# Patient Record
Sex: Female | Born: 1965 | Race: White | Hispanic: No | Marital: Married | Smoking: Former smoker
Health system: Southern US, Community
[De-identification: ages and names within clinical notes are randomized; demographics above are authoritative.]

## PROBLEM LIST (undated history)

## (undated) DIAGNOSIS — T7840XA Allergy, unspecified, initial encounter: Secondary | ICD-10-CM

## (undated) DIAGNOSIS — I10 Essential (primary) hypertension: Secondary | ICD-10-CM

## (undated) HISTORY — DX: Allergy, unspecified, initial encounter: T78.40XA

## (undated) HISTORY — DX: Essential (primary) hypertension: I10

---

## 1998-01-03 ENCOUNTER — Ambulatory Visit (HOSPITAL_COMMUNITY): Admission: RE | Admit: 1998-01-03 | Discharge: 1998-01-03 | Payer: Self-pay | Admitting: Obstetrics and Gynecology

## 1998-01-06 ENCOUNTER — Other Ambulatory Visit: Admission: RE | Admit: 1998-01-06 | Discharge: 1998-01-06 | Payer: Self-pay | Admitting: Obstetrics and Gynecology

## 1998-01-08 ENCOUNTER — Inpatient Hospital Stay (HOSPITAL_COMMUNITY): Admission: AD | Admit: 1998-01-08 | Discharge: 1998-01-08 | Payer: Self-pay | Admitting: Obstetrics and Gynecology

## 1998-01-12 ENCOUNTER — Ambulatory Visit (HOSPITAL_COMMUNITY): Admission: AD | Admit: 1998-01-12 | Discharge: 1998-01-12 | Payer: Self-pay | Admitting: Obstetrics and Gynecology

## 1998-01-15 ENCOUNTER — Ambulatory Visit (HOSPITAL_COMMUNITY): Admission: AD | Admit: 1998-01-15 | Discharge: 1998-01-15 | Payer: Self-pay | Admitting: Obstetrics and Gynecology

## 1998-02-24 ENCOUNTER — Ambulatory Visit (HOSPITAL_COMMUNITY): Admission: RE | Admit: 1998-02-24 | Discharge: 1998-02-24 | Payer: Self-pay | Admitting: Obstetrics and Gynecology

## 1998-09-03 ENCOUNTER — Other Ambulatory Visit: Admission: RE | Admit: 1998-09-03 | Discharge: 1998-09-03 | Payer: Self-pay | Admitting: Obstetrics and Gynecology

## 1999-03-13 ENCOUNTER — Inpatient Hospital Stay (HOSPITAL_COMMUNITY): Admission: AD | Admit: 1999-03-13 | Discharge: 1999-03-16 | Payer: Self-pay | Admitting: Obstetrics and Gynecology

## 1999-04-28 ENCOUNTER — Other Ambulatory Visit: Admission: RE | Admit: 1999-04-28 | Discharge: 1999-04-28 | Payer: Self-pay | Admitting: Obstetrics and Gynecology

## 2000-05-23 ENCOUNTER — Other Ambulatory Visit: Admission: RE | Admit: 2000-05-23 | Discharge: 2000-05-23 | Payer: Self-pay | Admitting: Obstetrics and Gynecology

## 2001-07-31 ENCOUNTER — Other Ambulatory Visit: Admission: RE | Admit: 2001-07-31 | Discharge: 2001-07-31 | Payer: Self-pay | Admitting: Obstetrics and Gynecology

## 2002-09-24 ENCOUNTER — Other Ambulatory Visit: Admission: RE | Admit: 2002-09-24 | Discharge: 2002-09-24 | Payer: Self-pay | Admitting: Obstetrics and Gynecology

## 2004-02-24 ENCOUNTER — Other Ambulatory Visit: Admission: RE | Admit: 2004-02-24 | Discharge: 2004-02-24 | Payer: Self-pay | Admitting: Obstetrics and Gynecology

## 2007-03-23 ENCOUNTER — Encounter: Admission: RE | Admit: 2007-03-23 | Discharge: 2007-03-23 | Payer: Self-pay | Admitting: Obstetrics and Gynecology

## 2008-03-26 ENCOUNTER — Encounter: Admission: RE | Admit: 2008-03-26 | Discharge: 2008-03-26 | Payer: Self-pay | Admitting: Obstetrics and Gynecology

## 2010-11-03 ENCOUNTER — Encounter
Admission: RE | Admit: 2010-11-03 | Discharge: 2010-11-03 | Payer: Self-pay | Source: Home / Self Care | Attending: Obstetrics and Gynecology | Admitting: Obstetrics and Gynecology

## 2010-11-22 ENCOUNTER — Encounter: Payer: Self-pay | Admitting: Obstetrics and Gynecology

## 2010-11-23 ENCOUNTER — Encounter: Payer: Self-pay | Admitting: Obstetrics and Gynecology

## 2011-12-24 ENCOUNTER — Other Ambulatory Visit: Payer: Self-pay | Admitting: Obstetrics and Gynecology

## 2011-12-24 DIAGNOSIS — Z1231 Encounter for screening mammogram for malignant neoplasm of breast: Secondary | ICD-10-CM

## 2012-01-12 ENCOUNTER — Ambulatory Visit
Admission: RE | Admit: 2012-01-12 | Discharge: 2012-01-12 | Disposition: A | Discharge status: Home / Self Care | Payer: BC Managed Care – PPO | Source: Ambulatory Visit | Attending: Obstetrics and Gynecology | Admitting: Obstetrics and Gynecology

## 2012-01-12 DIAGNOSIS — Z1231 Encounter for screening mammogram for malignant neoplasm of breast: Secondary | ICD-10-CM

## 2013-02-28 ENCOUNTER — Other Ambulatory Visit: Payer: Self-pay

## 2013-02-28 DIAGNOSIS — Z1231 Encounter for screening mammogram for malignant neoplasm of breast: Secondary | ICD-10-CM

## 2013-03-28 ENCOUNTER — Ambulatory Visit
Admission: RE | Admit: 2013-03-28 | Discharge: 2013-03-28 | Disposition: A | Discharge status: Home / Self Care | Payer: BC Managed Care – PPO | Source: Ambulatory Visit

## 2013-03-28 DIAGNOSIS — Z1231 Encounter for screening mammogram for malignant neoplasm of breast: Secondary | ICD-10-CM

## 2013-05-16 ENCOUNTER — Encounter: Payer: BC Managed Care – PPO | Admitting: Vascular Surgery

## 2015-01-17 ENCOUNTER — Other Ambulatory Visit: Payer: Self-pay

## 2015-01-17 DIAGNOSIS — Z1231 Encounter for screening mammogram for malignant neoplasm of breast: Secondary | ICD-10-CM

## 2015-01-20 ENCOUNTER — Other Ambulatory Visit: Payer: Self-pay | Admitting: General Practice

## 2015-02-10 ENCOUNTER — Encounter (INDEPENDENT_AMBULATORY_CARE_PROVIDER_SITE_OTHER): Payer: Self-pay

## 2015-02-10 ENCOUNTER — Ambulatory Visit
Admission: RE | Admit: 2015-02-10 | Discharge: 2015-02-10 | Disposition: A | Discharge status: Home / Self Care | Payer: BLUE CROSS/BLUE SHIELD | Source: Ambulatory Visit

## 2015-02-10 ENCOUNTER — Other Ambulatory Visit: Payer: Self-pay

## 2015-02-10 DIAGNOSIS — Z1231 Encounter for screening mammogram for malignant neoplasm of breast: Secondary | ICD-10-CM

## 2015-10-03 ENCOUNTER — Encounter: Payer: Self-pay | Admitting: Internal Medicine

## 2015-12-05 ENCOUNTER — Encounter: Payer: Self-pay | Admitting: Obstetrics and Gynecology

## 2015-12-08 ENCOUNTER — Encounter: Payer: BLUE CROSS/BLUE SHIELD | Admitting: Internal Medicine

## 2017-06-16 ENCOUNTER — Ambulatory Visit: Payer: BLUE CROSS/BLUE SHIELD | Admitting: Podiatry

## 2018-08-17 ENCOUNTER — Other Ambulatory Visit: Payer: Self-pay | Admitting: Obstetrics and Gynecology

## 2018-08-17 DIAGNOSIS — Z1231 Encounter for screening mammogram for malignant neoplasm of breast: Secondary | ICD-10-CM

## 2018-09-05 ENCOUNTER — Ambulatory Visit
Admission: RE | Admit: 2018-09-05 | Discharge: 2018-09-05 | Disposition: A | Discharge status: Home / Self Care | Payer: BLUE CROSS/BLUE SHIELD | Source: Ambulatory Visit | Attending: Obstetrics and Gynecology | Admitting: Obstetrics and Gynecology

## 2018-09-05 DIAGNOSIS — Z1231 Encounter for screening mammogram for malignant neoplasm of breast: Secondary | ICD-10-CM

## 2018-09-25 DIAGNOSIS — Z13820 Encounter for screening for osteoporosis: Secondary | ICD-10-CM | POA: Diagnosis not present

## 2018-09-25 DIAGNOSIS — Z6835 Body mass index (BMI) 35.0-35.9, adult: Secondary | ICD-10-CM | POA: Diagnosis not present

## 2018-09-25 DIAGNOSIS — Z01419 Encounter for gynecological examination (general) (routine) without abnormal findings: Secondary | ICD-10-CM | POA: Diagnosis not present

## 2018-12-12 DIAGNOSIS — D485 Neoplasm of uncertain behavior of skin: Secondary | ICD-10-CM | POA: Diagnosis not present

## 2018-12-18 DIAGNOSIS — H6123 Impacted cerumen, bilateral: Secondary | ICD-10-CM | POA: Diagnosis not present

## 2019-08-14 DIAGNOSIS — Z23 Encounter for immunization: Secondary | ICD-10-CM | POA: Diagnosis not present

## 2019-08-31 ENCOUNTER — Other Ambulatory Visit: Payer: Self-pay | Admitting: Obstetrics and Gynecology

## 2019-08-31 DIAGNOSIS — Z1231 Encounter for screening mammogram for malignant neoplasm of breast: Secondary | ICD-10-CM

## 2019-10-15 DIAGNOSIS — R590 Localized enlarged lymph nodes: Secondary | ICD-10-CM | POA: Diagnosis not present

## 2019-10-22 ENCOUNTER — Other Ambulatory Visit: Payer: Self-pay

## 2019-10-22 ENCOUNTER — Ambulatory Visit
Admission: RE | Admit: 2019-10-22 | Discharge: 2019-10-22 | Disposition: A | Discharge status: Home / Self Care | Payer: BLUE CROSS/BLUE SHIELD | Source: Ambulatory Visit | Attending: Obstetrics and Gynecology | Admitting: Obstetrics and Gynecology

## 2019-10-22 DIAGNOSIS — Z1231 Encounter for screening mammogram for malignant neoplasm of breast: Secondary | ICD-10-CM

## 2019-11-05 DIAGNOSIS — J324 Chronic pansinusitis: Secondary | ICD-10-CM | POA: Insufficient documentation

## 2019-11-05 DIAGNOSIS — J3089 Other allergic rhinitis: Secondary | ICD-10-CM | POA: Diagnosis not present

## 2019-11-05 DIAGNOSIS — K118 Other diseases of salivary glands: Secondary | ICD-10-CM | POA: Diagnosis not present

## 2019-11-05 DIAGNOSIS — J309 Allergic rhinitis, unspecified: Secondary | ICD-10-CM | POA: Insufficient documentation

## 2019-11-05 DIAGNOSIS — Z01419 Encounter for gynecological examination (general) (routine) without abnormal findings: Secondary | ICD-10-CM | POA: Diagnosis not present

## 2019-11-05 DIAGNOSIS — Z6836 Body mass index (BMI) 36.0-36.9, adult: Secondary | ICD-10-CM | POA: Diagnosis not present

## 2019-12-03 DIAGNOSIS — J324 Chronic pansinusitis: Secondary | ICD-10-CM | POA: Diagnosis not present

## 2019-12-03 DIAGNOSIS — D110 Benign neoplasm of parotid gland: Secondary | ICD-10-CM | POA: Diagnosis not present

## 2019-12-03 DIAGNOSIS — K118 Other diseases of salivary glands: Secondary | ICD-10-CM | POA: Diagnosis not present

## 2020-01-11 DIAGNOSIS — K118 Other diseases of salivary glands: Secondary | ICD-10-CM | POA: Diagnosis not present

## 2020-01-11 DIAGNOSIS — J3089 Other allergic rhinitis: Secondary | ICD-10-CM | POA: Diagnosis not present

## 2020-01-16 ENCOUNTER — Other Ambulatory Visit: Payer: Self-pay | Admitting: Otolaryngology

## 2020-01-18 ENCOUNTER — Encounter: Payer: Self-pay | Admitting: Obstetrics and Gynecology

## 2020-02-13 ENCOUNTER — Encounter (HOSPITAL_COMMUNITY)
Admission: RE | Admit: 2020-02-13 | Discharge: 2020-02-13 | Disposition: A | Discharge status: Home / Self Care | Payer: BC Managed Care – PPO | Source: Ambulatory Visit | Attending: Otolaryngology | Admitting: Otolaryngology

## 2020-02-13 ENCOUNTER — Other Ambulatory Visit (HOSPITAL_COMMUNITY)
Admission: RE | Admit: 2020-02-13 | Discharge: 2020-02-13 | Disposition: A | Discharge status: Home / Self Care | Payer: BC Managed Care – PPO | Source: Ambulatory Visit | Attending: Otolaryngology | Admitting: Otolaryngology

## 2020-02-13 ENCOUNTER — Encounter (HOSPITAL_COMMUNITY): Payer: Self-pay

## 2020-02-13 ENCOUNTER — Other Ambulatory Visit: Payer: Self-pay

## 2020-02-13 DIAGNOSIS — Z01812 Encounter for preprocedural laboratory examination: Secondary | ICD-10-CM | POA: Diagnosis not present

## 2020-02-13 DIAGNOSIS — Z20822 Contact with and (suspected) exposure to covid-19: Secondary | ICD-10-CM | POA: Diagnosis not present

## 2020-02-13 LAB — CBC
HCT: 45.9 % (ref 36.0–46.0)
Hemoglobin: 14.8 g/dL (ref 12.0–15.0)
MCH: 29.7 pg (ref 26.0–34.0)
MCHC: 32.2 g/dL (ref 30.0–36.0)
MCV: 92 fL (ref 80.0–100.0)
Platelets: 289 10*3/uL (ref 150–400)
RBC: 4.99 MIL/uL (ref 3.87–5.11)
RDW: 12.1 % (ref 11.5–15.5)
WBC: 7.4 10*3/uL (ref 4.0–10.5)
nRBC: 0 % (ref 0.0–0.2)

## 2020-02-13 LAB — BASIC METABOLIC PANEL
Anion gap: 11 (ref 5–15)
BUN: 14 mg/dL (ref 6–20)
CO2: 25 mmol/L (ref 22–32)
Calcium: 9.7 mg/dL (ref 8.9–10.3)
Chloride: 104 mmol/L (ref 98–111)
Creatinine, Ser: 0.75 mg/dL (ref 0.44–1.00)
GFR calc Af Amer: >60 mL/min (ref >60)
GFR calc non Af Amer: >60 mL/min (ref >60)
Glucose, Bld: 110 mg/dL — ABNORMAL HIGH (ref 70–99)
Potassium: 4 mmol/L (ref 3.5–5.1)
Sodium: 140 mmol/L (ref 135–145)

## 2020-02-13 LAB — SARS CORONAVIRUS 2 (TAT 6-24 HRS): SARS Coronavirus 2: NEGATIVE

## 2020-02-13 NOTE — Progress Notes (Signed)
PCP - pt denies Cardiologist - pt denies  Per pt, she doesn't go to the doctor, but she is currently in the process of finding a PCP.    Chest x-ray - n/a EKG - n/a Stress Test - pt denies ECHO - pt denies Cardiac Cath - pt denies   COVID TEST- 02/13/20   Anesthesia review: elevated BP in PAT  During PAT pt initial BP 172/97. Pt stated she felt a little anxious and nervous because she doesn't ever go to the doctors. Pt states that she has a cuff at home to check pressures with but it's been awhile since she's checked it. When pt did check it, she said her pressures were about 120s/70s. Pt has no prior health issues or medical history. Pt VS otherwise stable. Jeneen Rinks, [place] made aware. Before pt left PAT, pressures were rechecked and BP 166/94.  Pt was instructed to go home and routinely check pressures prior to surgery. Jeneen Rinks, [place] informed pt he would be following up with her to see how her pressures were at home prior to surgery. Pt given number to call for any questions or concerns.   Patient denies shortness of breath, fever, cough and chest pain at PAT appointment   All instructions explained to the patient, with a verbal understanding of the material. Patient agrees to go over the instructions while at home for a better understanding. Patient also instructed to self quarantine after being tested for COVID-19. The opportunity to ask questions was provided.

## 2020-02-13 NOTE — Progress Notes (Signed)
"[name]  Your procedure is scheduled on Friday April 16  Report to [facility] Main Entrance "A" at 08:15 A.M., and check in at the Admitting office.  Call this number if you have problems the morning of surgery: [phone]  Call [phone] if you have any questions prior to your surgery date Monday-Friday 8am-4pm   Remember: Do not eat or drink after midnight the night before your surgery    Take these medicines the morning of surgery with A SIP OF WATER - none  As of today, STOP taking any Aspirin (unless otherwise instructed by your surgeon), Aleve, Naproxen, Ibuprofen, Motrin, Advil, Goody's, BC's, all herbal medications, fish oil, and all vitamins.    The Morning of Surgery  Do not wear jewelry, make-up or nail polish.  Do not wear lotions, powders, or perfumes, or deodorant  Do not shave 48 hours prior to surgery.    Do not bring valuables to the hospital.  [facility] is not responsible for any belongings or valuables.  If you are a smoker, DO NOT Smoke 24 hours prior to surgery  If you wear a CPAP at night please bring your mask the morning of surgery   Remember that you must have someone to transport you home after your surgery, and remain with you for 24 hours if you are discharged the same day.   Please bring cases for contacts, glasses, hearing aids, dentures or bridgework because it cannot be worn into surgery.    Leave your suitcase in the car.  After surgery it may be brought to your room.  For patients admitted to the hospital, discharge time will be determined by your treatment team.  Patients discharged the day of surgery will not be allowed to drive home.    Special instructions:   [place]- Preparing For Surgery  Before surgery, you can play an important role. Because skin is not sterile, your skin needs to be as free of germs as possible. You can reduce the number of germs on your skin by washing with CHG (chlorahexidine  gluconate) Soap before surgery.  CHG is an antiseptic cleaner which kills germs and bonds with the skin to continue killing germs even after washing.    Oral Hygiene is also important to reduce your risk of infection.  Remember - BRUSH YOUR TEETH THE MORNING OF SURGERY WITH YOUR REGULAR TOOTHPASTE  Please do not use if you have an allergy to CHG or antibacterial soaps. If your skin becomes reddened/irritated stop using the CHG.  Do not shave (including legs and underarms) for at least 48 hours prior to first CHG shower. It is OK to shave your face.  Please follow these instructions carefully.   1. Shower the NIGHT BEFORE SURGERY and the MORNING OF SURGERY with CHG Soap.   2. If you chose to wash your hair and body, wash as usual with your normal shampoo and body-wash/soap.  3. Rinse your hair and body thoroughly to remove the shampoo and soap.  4. Apply CHG directly to the skin (ONLY FROM THE NECK DOWN) and wash gently with a scrungie or a clean washcloth.   5. Do not use on open wounds or open sores. Avoid contact with your eyes, ears, mouth and genitals (private parts). Wash Face and genitals (private parts)  with your normal soap.   6. Wash thoroughly, paying special attention to the area where your surgery will be performed.  7. Thoroughly rinse your body with warm water from  the neck down.  8. DO NOT shower/wash with your normal soap after using and rinsing off the CHG Soap.  9. Pat yourself dry with a CLEAN TOWEL.  10. Wear CLEAN PAJAMAS to bed the night before surgery  11. Place CLEAN SHEETS on your bed the night of your first shower and DO NOT SLEEP WITH PETS.  12. Wear comfortable clothes the morning of surgery.     Day of Surgery:  Please shower the morning of surgery with the CHG soap Do not apply any deodorants/lotions. Please wear clean clothes to the hospital/surgery center.   Remember to brush your teeth WITH YOUR REGULAR TOOTHPASTE.   Please read over the  following fact sheets that you were given.

## 2020-02-14 NOTE — Anesthesia Preprocedure Evaluation (Addendum)
Anesthesia Evaluation  Patient identified by MRN, date of birth, ID band Patient awake    Reviewed: Allergy & Precautions, H&P , NPO status , Patient's Chart, lab work & pertinent test results  Airway Mallampati: II   Neck ROM: full    Dental   Pulmonary former smoker,    breath sounds clear to auscultation       Cardiovascular negative cardio ROS   Rhythm:regular Rate:Normal     Neuro/Psych    GI/Hepatic   Endo/Other  Parotid mass  Renal/GU      Musculoskeletal   Abdominal   Peds  Hematology   Anesthesia Other Findings   Reproductive/Obstetrics                            Anesthesia Physical Anesthesia Plan  ASA: II  Anesthesia Plan: General   Post-op Pain Management:    Induction: Intravenous  PONV Risk Score and Plan: 3 and Ondansetron, Dexamethasone, Midazolam and Treatment may vary due to age or medical condition  Airway Management Planned: Oral ETT  Additional Equipment:   Intra-op Plan:   Post-operative Plan: Extubation in OR  Informed Consent: I have reviewed the patients History and Physical, chart, labs and discussed the procedure including the risks, benefits and alternatives for the proposed anesthesia with the patient or authorized representative who has indicated his/her understanding and acceptance.       Plan Discussed with: CRNA, Anesthesiologist and Surgeon  Anesthesia Plan Comments: (BP elevated at PAT, 166/94. Pt denies hx of HTN, she does not take any antiHTN meds. She actually takes no prescription medications, reports no significant medical history, says she has always been in good health. She does not currently have a PCP. 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 She reports she does have a BP machine at home and she was advised to continue  to monitor. She is actively looking to establish with new PCP.  Followed up with pt via phone on 02/14/20. She has been taking BP at home, last reading 164/98. She is very anxious about surgery. She reports walking 3 miles on her treadmill every morning and denies any CV complaints.   Discussed case with Dr. Barnie Mort, okay to proceed as planned, BP will be assessed on DOS, does not need DOS EKG. Recommended pt establish with PCP in near future to followup on elevated BP. Pt aware.  Preop labs reviewed, WNL.)       Anesthesia Quick Evaluation

## 2020-02-14 NOTE — Progress Notes (Signed)
Anesthesia Chart Review:  BP elevated at PAT, 166/94. Pt denies hx of HTN, she does not take any antiHTN meds. She actually takes no prescription medications, reports no significant medical history, says she has always been in good health. She does not currently have a PCP. 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 She reports she does have a BP machine at home and she was advised to continue to monitor. She is actively looking to establish with new PCP.  Followed up with pt via phone on 02/14/20. She has been taking BP at home, last reading 164/98. She is very anxious about surgery. She reports walking 3 miles on her treadmill every morning and denies any CV complaints.   Discussed case with Dr. Barnie Mort, okay to proceed as planned, BP will be assessed on DOS, does not need DOS EKG. Recommended pt establish with PCP in near future to followup on elevated BP. Pt aware.  Preop labs reviewed, WNL.  Wynonia Musty [facility]/Anesthesiology Phone [phone] 02/14/2020 2:53 PM

## 2020-02-15 ENCOUNTER — Observation Stay (HOSPITAL_COMMUNITY)
Admission: RE | Admit: 2020-02-15 | Discharge: 2020-02-16 | Disposition: A | Discharge status: Home / Self Care | Payer: BC Managed Care – PPO | Attending: Otolaryngology | Admitting: Otolaryngology

## 2020-02-15 ENCOUNTER — Encounter (HOSPITAL_COMMUNITY)
Admission: RE | Disposition: A | Discharge status: Home / Self Care | Payer: Self-pay | Source: Home / Self Care | Attending: Otolaryngology

## 2020-02-15 ENCOUNTER — Other Ambulatory Visit: Payer: Self-pay

## 2020-02-15 ENCOUNTER — Ambulatory Visit (HOSPITAL_COMMUNITY): Payer: BC Managed Care – PPO | Admitting: Certified Registered Nurse Anesthetist

## 2020-02-15 ENCOUNTER — Ambulatory Visit (HOSPITAL_COMMUNITY): Payer: BC Managed Care – PPO | Admitting: Physician Assistant

## 2020-02-15 ENCOUNTER — Encounter (HOSPITAL_COMMUNITY): Payer: Self-pay | Admitting: Otolaryngology

## 2020-02-15 DIAGNOSIS — K118 Other diseases of salivary glands: Secondary | ICD-10-CM | POA: Diagnosis not present

## 2020-02-15 DIAGNOSIS — D110 Benign neoplasm of parotid gland: Principal | ICD-10-CM | POA: Insufficient documentation

## 2020-02-15 DIAGNOSIS — R590 Localized enlarged lymph nodes: Secondary | ICD-10-CM | POA: Diagnosis not present

## 2020-02-15 DIAGNOSIS — Z87891 Personal history of nicotine dependence: Secondary | ICD-10-CM | POA: Diagnosis not present

## 2020-02-15 DIAGNOSIS — J3089 Other allergic rhinitis: Secondary | ICD-10-CM | POA: Diagnosis not present

## 2020-02-15 HISTORY — PX: PAROTIDECTOMY: SHX2163

## 2020-02-15 SURGERY — EXCISION, PAROTID GLAND
Anesthesia: General | Site: Neck | Laterality: Right

## 2020-02-15 MED ORDER — PROPOFOL 10 MG/ML IV BOLUS
INTRAVENOUS | Status: DC | PRN
  Administered 2020-02-15: 150 mg via INTRAVENOUS
  Administered 2020-02-15: 10 mg via INTRAVENOUS

## 2020-02-15 MED ORDER — BACITRACIN ZINC 500 UNIT/GM EX OINT
1.0000 "application " | TOPICAL_OINTMENT | Freq: Three times a day (TID) | CUTANEOUS | Status: DC
  Administered 2020-02-15 – 2020-02-16 (×3): 1 via TOPICAL
  Filled 2020-02-15: qty 28.35

## 2020-02-15 MED ORDER — FENTANYL CITRATE (PF) 100 MCG/2ML IJ SOLN
INTRAMUSCULAR | Status: AC
  Filled 2020-02-15: qty 2

## 2020-02-15 MED ORDER — FENTANYL CITRATE (PF) 250 MCG/5ML IJ SOLN
INTRAMUSCULAR | Status: AC
  Filled 2020-02-15: qty 5

## 2020-02-15 MED ORDER — KCL IN DEXTROSE-NACL 20-5-0.45 MEQ/L-%-% IV SOLN
INTRAVENOUS | Status: DC
  Filled 2020-02-15 (×2): qty 1000

## 2020-02-15 MED ORDER — DEXAMETHASONE SODIUM PHOSPHATE 10 MG/ML IJ SOLN
INTRAMUSCULAR | Status: AC
  Filled 2020-02-15: qty 1

## 2020-02-15 MED ORDER — 0.9 % SODIUM CHLORIDE (POUR BTL) OPTIME
TOPICAL | Status: DC | PRN
  Administered 2020-02-15: 1000 mL

## 2020-02-15 MED ORDER — LIDOCAINE-EPINEPHRINE 1 %-1:100000 IJ SOLN
INTRAMUSCULAR | Status: DC | PRN
  Administered 2020-02-15: 3 mL

## 2020-02-15 MED ORDER — FENTANYL CITRATE (PF) 250 MCG/5ML IJ SOLN
INTRAMUSCULAR | Status: DC | PRN
  Administered 2020-02-15: 50 ug via INTRAVENOUS
  Administered 2020-02-15: 100 ug via INTRAVENOUS
  Administered 2020-02-15 (×2): 50 ug via INTRAVENOUS

## 2020-02-15 MED ORDER — LIDOCAINE 2% (20 MG/ML) 5 ML SYRINGE
INTRAMUSCULAR | Status: AC
  Filled 2020-02-15: qty 5

## 2020-02-15 MED ORDER — SUCCINYLCHOLINE CHLORIDE 200 MG/10ML IV SOSY
PREFILLED_SYRINGE | INTRAVENOUS | Status: DC | PRN
  Administered 2020-02-15: 100 mg via INTRAVENOUS

## 2020-02-15 MED ORDER — CEFAZOLIN SODIUM-DEXTROSE 1-4 GM/50ML-% IV SOLN
1.0000 g | Freq: Three times a day (TID) | INTRAVENOUS | Status: AC
  Administered 2020-02-15 (×2): 1 g via INTRAVENOUS
  Filled 2020-02-15 (×2): qty 50

## 2020-02-15 MED ORDER — MIDAZOLAM HCL 2 MG/2ML IJ SOLN
INTRAMUSCULAR | Status: AC
  Filled 2020-02-15: qty 2

## 2020-02-15 MED ORDER — OXYCODONE HCL 5 MG PO TABS
ORAL_TABLET | ORAL | Status: AC
  Filled 2020-02-15: qty 1

## 2020-02-15 MED ORDER — SUCCINYLCHOLINE CHLORIDE 200 MG/10ML IV SOSY
PREFILLED_SYRINGE | INTRAVENOUS | Status: AC
  Filled 2020-02-15: qty 10

## 2020-02-15 MED ORDER — TRAMADOL HCL 50 MG PO TABS
50.0000 mg | ORAL_TABLET | Freq: Four times a day (QID) | ORAL | Status: DC | PRN
  Administered 2020-02-15: 21:00:00 50 mg via ORAL
  Filled 2020-02-15: qty 1

## 2020-02-15 MED ORDER — BACITRACIN ZINC 500 UNIT/GM EX OINT
TOPICAL_OINTMENT | CUTANEOUS | Status: AC
  Filled 2020-02-15: qty 28.35

## 2020-02-15 MED ORDER — ONDANSETRON HCL 4 MG/2ML IJ SOLN
INTRAMUSCULAR | Status: DC | PRN
  Administered 2020-02-15: 4 mg via INTRAVENOUS

## 2020-02-15 MED ORDER — OXYCODONE HCL 5 MG/5ML PO SOLN: 5.0000 mg | Freq: Once | ORAL | Status: AC | PRN

## 2020-02-15 MED ORDER — ACETAMINOPHEN 650 MG RE SUPP
650.0000 mg | RECTAL | Status: DC | PRN
  Filled 2020-02-15: qty 1

## 2020-02-15 MED ORDER — BACITRACIN ZINC 500 UNIT/GM EX OINT
TOPICAL_OINTMENT | CUTANEOUS | Status: DC | PRN
  Administered 2020-02-15: 1 via TOPICAL

## 2020-02-15 MED ORDER — LIDOCAINE 2% (20 MG/ML) 5 ML SYRINGE
INTRAMUSCULAR | Status: DC | PRN
  Administered 2020-02-15 (×2): 50 mg via INTRAVENOUS

## 2020-02-15 MED ORDER — FENTANYL CITRATE (PF) 100 MCG/2ML IJ SOLN
25.0000 ug | INTRAMUSCULAR | Status: DC | PRN
  Administered 2020-02-15 (×3): 50 ug via INTRAVENOUS

## 2020-02-15 MED ORDER — CEFAZOLIN SODIUM-DEXTROSE 2-4 GM/100ML-% IV SOLN
2.0000 g | INTRAVENOUS | Status: AC
  Administered 2020-02-15: 08:00:00 2 g via INTRAVENOUS

## 2020-02-15 MED ORDER — DEXAMETHASONE SODIUM PHOSPHATE 10 MG/ML IJ SOLN
INTRAMUSCULAR | Status: DC | PRN
  Administered 2020-02-15: 10 mg via INTRAVENOUS

## 2020-02-15 MED ORDER — ACETAMINOPHEN 325 MG PO TABS
ORAL_TABLET | ORAL | Status: AC
  Filled 2020-02-15: qty 2

## 2020-02-15 MED ORDER — CEFAZOLIN SODIUM-DEXTROSE 2-4 GM/100ML-% IV SOLN
INTRAVENOUS | Status: AC
  Filled 2020-02-15: qty 100

## 2020-02-15 MED ORDER — OXYCODONE HCL 5 MG PO TABS
5.0000 mg | ORAL_TABLET | Freq: Once | ORAL | Status: AC | PRN
  Administered 2020-02-15: 5 mg via ORAL

## 2020-02-15 MED ORDER — PROPOFOL 10 MG/ML IV BOLUS
INTRAVENOUS | Status: AC
  Filled 2020-02-15: qty 40

## 2020-02-15 MED ORDER — LIDOCAINE-EPINEPHRINE 1 %-1:100000 IJ SOLN
INTRAMUSCULAR | Status: AC
  Filled 2020-02-15: qty 1

## 2020-02-15 MED ORDER — MIDAZOLAM HCL 2 MG/2ML IJ SOLN
INTRAMUSCULAR | Status: DC | PRN
  Administered 2020-02-15: 2 mg via INTRAVENOUS

## 2020-02-15 MED ORDER — PROMETHAZINE HCL 25 MG RE SUPP
25.0000 mg | Freq: Four times a day (QID) | RECTAL | Status: DC | PRN
  Filled 2020-02-15: qty 1

## 2020-02-15 MED ORDER — ONDANSETRON HCL 4 MG/2ML IJ SOLN
INTRAMUSCULAR | Status: AC
  Filled 2020-02-15: qty 2

## 2020-02-15 MED ORDER — ACETAMINOPHEN 160 MG/5ML PO SOLN
ORAL | Status: AC
  Filled 2020-02-15: qty 20.3

## 2020-02-15 MED ORDER — PHENYLEPHRINE 40 MCG/ML (10ML) SYRINGE FOR IV PUSH (FOR BLOOD PRESSURE SUPPORT)
PREFILLED_SYRINGE | INTRAVENOUS | Status: AC
  Filled 2020-02-15: qty 10

## 2020-02-15 MED ORDER — PHENYLEPHRINE 40 MCG/ML (10ML) SYRINGE FOR IV PUSH (FOR BLOOD PRESSURE SUPPORT)
PREFILLED_SYRINGE | INTRAVENOUS | Status: DC | PRN
  Administered 2020-02-15 (×2): 80 ug via INTRAVENOUS

## 2020-02-15 MED ORDER — PROMETHAZINE HCL 25 MG PO TABS: 25.0000 mg | ORAL_TABLET | Freq: Four times a day (QID) | ORAL | Status: DC | PRN

## 2020-02-15 MED ORDER — ONDANSETRON HCL 4 MG/2ML IJ SOLN: 4.0000 mg | Freq: Four times a day (QID) | INTRAMUSCULAR | Status: DC | PRN

## 2020-02-15 MED ORDER — LACTATED RINGERS IV SOLN: INTRAVENOUS | Status: DC

## 2020-02-15 MED ORDER — ACETAMINOPHEN 160 MG/5ML PO SOLN
650.0000 mg | ORAL | Status: DC | PRN
  Administered 2020-02-15 (×2): 650 mg via ORAL
  Filled 2020-02-15 (×2): qty 20.3

## 2020-02-15 SURGICAL SUPPLY — 47 items
ATTRACTOMAT 16X20 MAGNETIC DRP (DRAPES) IMPLANT
BLADE CLIPPER SURG (BLADE) IMPLANT
BLADE SURG 15 STRL LF DISP TIS (BLADE) IMPLANT
BLADE SURG 15 STRL SS (BLADE)
CANISTER SUCT 3000ML PPV (MISCELLANEOUS) ×3 IMPLANT
CLEANER TIP ELECTROSURG 2X2 (MISCELLANEOUS) ×3 IMPLANT
CNTNR URN SCR LID CUP LEK RST (MISCELLANEOUS) ×2 IMPLANT
CONT SPEC 4OZ STRL OR WHT (MISCELLANEOUS) ×6
CORD BIPOLAR FORCEPS 12FT (ELECTRODE) ×3 IMPLANT
COVER SURGICAL LIGHT HANDLE (MISCELLANEOUS) ×3 IMPLANT
COVER WAND RF STERILE (DRAPES) ×3 IMPLANT
DRAIN JACKSON RD 7FR 3/32 (WOUND CARE) IMPLANT
DRAPE HALF SHEET 40X57 (DRAPES) IMPLANT
DRAPE INCISE 13X13 STRL (DRAPES) ×3 IMPLANT
ELECT COATED BLADE 2.86 ST (ELECTRODE) ×3 IMPLANT
ELECT PAIRED SUBDERMAL (MISCELLANEOUS) ×3
ELECT REM PT RETURN 9FT ADLT (ELECTROSURGICAL) ×3
ELECTRODE PAIRED SUBDERMAL (MISCELLANEOUS) ×1 IMPLANT
ELECTRODE REM PT RTRN 9FT ADLT (ELECTROSURGICAL) ×1 IMPLANT
EVACUATOR SILICONE 100CC (DRAIN) ×3 IMPLANT
FORCEPS BIPOLAR SPETZLER 8 1.0 (NEUROSURGERY SUPPLIES) ×3 IMPLANT
GAUZE 4X4 16PLY RFD (DISPOSABLE) ×3 IMPLANT
GLOVE BIO SURGEON STRL SZ7.5 (GLOVE) ×3 IMPLANT
GOWN STRL REUS W/ TWL LRG LVL3 (GOWN DISPOSABLE) ×2 IMPLANT
GOWN STRL REUS W/TWL LRG LVL3 (GOWN DISPOSABLE) ×6
KIT BASIN OR (CUSTOM PROCEDURE TRAY) ×3 IMPLANT
KIT TURNOVER KIT B (KITS) ×3 IMPLANT
LOOP VESSEL MINI RED (MISCELLANEOUS) ×3 IMPLANT
NEEDLE HYPO 25GX1X1/2 BEV (NEEDLE) ×3 IMPLANT
NS IRRIG 1000ML POUR BTL (IV SOLUTION) ×6 IMPLANT
PAD ARMBOARD 7.5X6 YLW CONV (MISCELLANEOUS) ×6 IMPLANT
PENCIL BUTTON HOLSTER BLD 10FT (ELECTRODE) ×3 IMPLANT
POSITIONER HEAD DONUT 9IN (MISCELLANEOUS) IMPLANT
PROBE NERVBE PRASS .33 (MISCELLANEOUS) ×3 IMPLANT
SHEARS HARMONIC 9CM CVD (BLADE) ×3 IMPLANT
SPECIMEN JAR MEDIUM (MISCELLANEOUS) ×3 IMPLANT
STAPLER VISISTAT 35W (STAPLE) ×3 IMPLANT
SUT ETHILON 2 0 FS 18 (SUTURE) ×3 IMPLANT
SUT ETHILON 5 0 P 3 18 (SUTURE) ×3
SUT NYLON ETHILON 5-0 P-3 1X18 (SUTURE) ×1 IMPLANT
SUT SILK 2 0 PERMA HAND 18 BK (SUTURE) ×3 IMPLANT
SUT SILK 2 0 SH CR/8 (SUTURE) ×3 IMPLANT
SUT SILK 3 0 REEL (SUTURE) ×3 IMPLANT
SUT VIC AB 3-0 SH 27 (SUTURE) ×3
SUT VIC AB 3-0 SH 27X BRD (SUTURE) ×1 IMPLANT
SUT VIC AB 4-0 PS2 27 (SUTURE) IMPLANT
TRAY ENT MC OR (CUSTOM PROCEDURE TRAY) ×3 IMPLANT

## 2020-02-15 NOTE — Transfer of Care (Signed)
Immediate Anesthesia Transfer of Care Note  Patient: [name]  Procedure(s) Performed: PAROTIDECTOMY (Right Neck)  Patient Location: PACU  Anesthesia Type:General  Level of Consciousness: patient cooperative and responds to stimulation  Airway & Oxygen Therapy: Patient Spontanous Breathing and Patient connected to nasal cannula oxygen  Post-op Assessment: Report given to RN and Post -op Vital signs reviewed and stable  Post vital signs: Reviewed and stable  Last Vitals:  Vitals Value Taken Time  BP 131/78 02/15/20 0924  Temp    Pulse 81 02/15/20 0925  Resp 15 02/15/20 0925  SpO2 97 % 02/15/20 0925  Vitals shown include unvalidated device data.  Last Pain:  Vitals:   02/15/20 0609  TempSrc:   PainSc: 0-No pain      Patients Stated Pain Goal: 3 ([id number])  Complications: No apparent anesthesia complications

## 2020-02-15 NOTE — Brief Op Note (Signed)
02/15/2020  9:17 AM  PATIENT:  [name]  54 y.o. female  PRE-OPERATIVE DIAGNOSIS:  right parotid mass  POST-OPERATIVE DIAGNOSIS:  right parotid mass  PROCEDURE:  Procedure(s): PAROTIDECTOMY (Right)  SURGEON:  Surgeon(s) and Role:    Melida Quitter, MD - Primary  PHYSICIAN ASSISTANT:   ASSISTANTSBlenda Nicely, MD  ANESTHESIA:   general  EBL: 50 cc  BLOOD ADMINISTERED:none  DRAINS: (7 fr) Jackson-Pratt drain(s) with closed bulb suction in the right neck   LOCAL MEDICATIONS USED:  LIDOCAINE   SPECIMEN:  Source of Specimen:  right parotid  DISPOSITION OF SPECIMEN:  PATHOLOGY  COUNTS:  YES  TOURNIQUET:  * No tourniquets in log *  DICTATION: .Other Dictation: Dictation Number [id number]  PLAN OF CARE: Admit for overnight observation  PATIENT DISPOSITION:  PACU - hemodynamically stable.   Delay start of Pharmacological VTE agent (>24hrs) due to surgical blood loss or risk of bleeding: yes

## 2020-02-15 NOTE — H&P (Signed)
[name] is an 54 y.o. female.   Chief Complaint: Parotid mass HPI: 54 year old female with right parotid mass noticed since November.  A needle biopsy was benign.  She presents for removal.  History reviewed. No pertinent past medical history.  Past Surgical History:  Procedure Laterality Date  . [place]    History reviewed. No pertinent family history. Social History:  reports that she has quit smoking. She has never used smokeless tobacco. She reports previous alcohol use. She reports that she does not use drugs.  Allergies: No Known Allergies  Medications Prior to Admission  Medication Sig Dispense Refill  . fexofenadine (ALLEGRA) 180 MG tablet Take 180 mg by mouth daily.      Results for orders placed or performed during the hospital encounter of 02/13/20 (from the past 48 hour(s))  SARS CORONAVIRUS 2 (TAT 6-24 HRS) Nasopharyngeal Nasopharyngeal Swab     Status: None   Collection Time: 02/13/20  2:32 PM   Specimen: Nasopharyngeal Swab  Result Value Ref Range   SARS Coronavirus 2 NEGATIVE NEGATIVE    Comment: (NOTE) SARS-CoV-2 target nucleic acids are NOT DETECTED. The SARS-CoV-2 RNA is generally detectable in upper and lower respiratory specimens during the acute phase of infection. Negative results do not preclude SARS-CoV-2 infection, do not rule out co-infections with other pathogens, and should not be used as the sole basis for treatment or other patient management decisions. Negative results must be combined with clinical observations, patient history, and epidemiological information. The expected result is Negative. Fact Sheet for Patients: SugarRoll.be Fact Sheet for Healthcare Providers: https://www.woods-mathews.com/ This test is not yet approved or cleared by the [place] FDA and  has been authorized for detection and/or diagnosis of SARS-CoV-2 by FDA under an Emergency Use  Authorization (EUA). This EUA will remain  in effect (meaning this test can be used) for the duration of the COVID-19 declaration under Section 56 4(b)(1) of the Act, 21 U.S.C. section 360bbb-3(b)(1), unless the authorization is terminated or revoked sooner. Performed at [facility], [place] [address], [place], [place] [address]    No results found.  Review of Systems  All other systems reviewed and are negative.   Blood pressure (!) 151/87, pulse 92, temperature 98.2 F (36.8 C), temperature source Oral, resp. rate 18, height 5\' 4"  (1.626 m), weight 92.6 kg, SpO2 98 %. Physical Exam  Constitutional: She is oriented to person, place, and time. She appears well-developed and well-nourished. No distress.  HENT:  Head: Normocephalic and atraumatic.  Right Ear: External ear normal.  Left Ear: External ear normal.  Nose: Nose normal.  Mouth/Throat: Oropharynx is clear and moist.  Eyes: Pupils are equal, round, and reactive to light. Conjunctivae and EOM are normal.  Neck:  Right parotid with firm mass in inferior gland.  Cardiovascular: Normal rate.  Respiratory: Effort normal.  Musculoskeletal:     Cervical back: Normal range of motion and neck supple.  Neurological: She is alert and oriented to person, place, and time. No cranial nerve deficit.  Skin: Skin is warm and dry.  Psychiatric: She has a normal mood and affect. Her behavior is normal. Judgment and thought content normal.     Assessment/Plan Right parotid mass  To OR for right parotidectomy.  Melida Quitter, MD 02/15/2020, 7:27 AM

## 2020-02-15 NOTE — Op Note (Signed)
NAMEMARLINDA, [name] MEDICAL RECORD [id number] ACCOUNT [id number] DATE OF BIRTH:12-13-1965 FACILITY: [facility] LOCATION: [facility] PHYSICIAN:Darelle Kings Guido Sander, MD  OPERATIVE REPORT  DATE OF PROCEDURE:  02/15/2020  PREOPERATIVE DIAGNOSIS:  Right benign parotid neoplasm.  POSTOPERATIVE DIAGNOSIS:  Right benign parotid neoplasm.  PROCEDURE:  Right superficial parotidectomy with dissection of facial nerve.  SURGEON:  Melida Quitter, MD.  ASSISTANT:  Dr. Lexine Baton who was necessary to assist in retraction and dissection.  ANESTHESIA:  General endotracheal anesthesia.  COMPLICATIONS:  None.  INDICATIONS:  The patient is a 53 year old female who has a mass in the lower parotid on the right that she noticed back in November and has not really changed in size.  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  FINDINGS:  A round 0.5 cm mass was found in the lower right parotid gland and was removed, along with a portion of the lower superficial parotid gland.  DESCRIPTION OF PROCEDURE:  The patient was identified in the holding room and informed consent having been obtained, including discussion of risks, benefits and alternatives, the patient was brought to the operative suite and put on the operative table  in supine position.  Anesthesia was induced and the patient was intubated by the anesthesia team without difficulty.  The patient was given intravenous antibiotics during the case.  The eyes were taped closed and the nerve integrity monitor was placed on  the right face and turned on for the case.  Incision was marked with a marking pen and injected with 1% lidocaine with 1:100,000 epinephrine.  The right face and neck were prepped and draped in sterile fashion.  The parotidectomy incision was made with  a 15 blade scalpel and extended through subcutaneous tissues using Bovie electrocautery.  A preparotid flap was  then elevated anteriorly and the earlobe was also freed.  Stay sutures were placed, retracting the flaps.  Dissection was then performed  anterior to the earlobe and continuing inferiorly until the sternocleidomastoid muscle was identified.  Dissection was then performed along the muscle, freeing the inferior portion of the parotid gland and parotid tail.  Dissection was then performed  along the tragal cartilage and the gland retracted anteriorly.  This was done until the main trunk of the facial nerve was easily identified and confirmed with a nerve integrity monitor.  The inferior division of the facial nerve was then dissected free  by blunt dissection and then division of the parotid gland over the nerve using the Harmonic scalpel.  This continued until the distal extent of the gland along that branch of the nerve.  The inferior gland was then able to be removed, along with the  mass, partly with the Harmonic scalpel, partly with cautery.  The specimen was passed to nursing for pathology.  The greater auricular nerve had to be sacrificed for removal of this portion of the gland.  At this point, with the specimen removed, the  wound was copiously irrigated with saline.  The flaps were released and laid back in place after a 7-French round drain was placed in the depth of the wound.  The drain was secured at the skin level using 2-0 nylon suture in a standard drain stitch.   Incision was then closed in the subcutaneous layer using 3-0 Vicryl suture in a simple running fashion.  The skin was closed with 5-0 nylon in a simple running fashion.  The drain was placed to  bulb suction and taped to the right shoulder after removing  the drapes.  The patient was cleaned off and bacitracin ointment was added to the incision.  She was then returned to anesthesia for wakeup and was extubated and taken to the recovery room in stable condition.  VN/NUANCE  D:02/15/2020 T:02/15/2020 JOB:[id number]

## 2020-02-15 NOTE — Anesthesia Postprocedure Evaluation (Signed)
Anesthesia Post Note  Patient: [name]  Procedure(s) Performed: PAROTIDECTOMY (Right Neck)     Patient location during evaluation: PACU Anesthesia Type: General Level of consciousness: awake and alert Pain management: pain level controlled Vital Signs Assessment: post-procedure vital signs reviewed and stable Respiratory status: spontaneous breathing, nonlabored ventilation, respiratory function stable and patient connected to nasal cannula oxygen Cardiovascular status: blood pressure returned to baseline and stable Postop Assessment: no apparent nausea or vomiting Anesthetic complications: no    Last Vitals:  Vitals:   02/15/20 1200 02/15/20 1303  BP: 140/78 (!) 141/89  Pulse: 81 97  Resp: 13 15  Temp:  36.8 C  SpO2: 97% 98%    Last Pain:  Vitals:   02/15/20 1303  TempSrc: Oral  PainSc:                  [place] S

## 2020-02-15 NOTE — Progress Notes (Signed)
   Subjective:    Patient ID: [name], female    DOB: [birth year], 54 y.o.   MRN: [id number]  HPI She is doing well.  Very little discomfort.  Some numbness about the right ear.  Review of Systems     Objective:   Physical Exam AF VSS Alert, NAD Drain functioning Parotid incision clean and intact, no fluid collection Right facial movement normal     Assessment & Plan:  S/p right parotidectomy  Observe overnight.

## 2020-02-16 DIAGNOSIS — Z87891 Personal history of nicotine dependence: Secondary | ICD-10-CM | POA: Diagnosis not present

## 2020-02-16 DIAGNOSIS — D110 Benign neoplasm of parotid gland: Secondary | ICD-10-CM | POA: Diagnosis not present

## 2020-02-16 MED ORDER — TRAMADOL HCL 50 MG PO TABS: 50.0000 mg | ORAL_TABLET | Freq: Four times a day (QID) | ORAL | 0 refills | Status: DC | PRN

## 2020-02-16 NOTE — Plan of Care (Signed)

## 2020-02-16 NOTE — Progress Notes (Signed)
[name] to be D/C'd  per MD order. Discussed with the patient and all questions fully answered.  VSS, Skin clean, dry and intact without evidence of skin break down, no evidence of skin tears noted.  IV catheter discontinued intact. Site without signs and symptoms of complications. Dressing and pressure applied.  An After Visit Summary was printed and given to the patient. Patient received prescription.  D/c education completed with patient/family including follow up instructions, medication list, d/c activities limitations if indicated, with other d/c instructions as indicated by MD - patient able to verbalize understanding, all questions fully answered.   Patient instructed to return to ED, call 911, or call MD for any changes in condition.   Patient to be escorted via [place], and D/C home via private auto.

## 2020-02-16 NOTE — Discharge Summary (Signed)
Physician Discharge Summary  Patient ID: [name] MRN: [id number] DOB/AGE: 1966-07-12 54 y.o.  Admit date: 02/15/2020 Discharge date: 02/16/2020  Admission Diagnoses: Right parotid benign neoplasm  Discharge Diagnoses:  Active Problems:   Benign neoplasm of parotid gland   Discharged Condition: good  Hospital Course: 54 year old female with right parotid neoplasm presented for resection.  See operative note.  She was observed overnight with drain in place and did quite well with very little pain.  On POD 1, the drain was removed and she was felt to be stable for discharge home.  Consults: None  Significant Diagnostic Studies: None  Treatments: surgery: Right parotidectomy  Discharge Exam: Blood pressure 136/82, pulse 85, temperature 97.7 F (36.5 C), temperature source Oral, resp. rate 16, height 5\' 4"  (1.626 m), weight 92.6 kg, SpO2 98 %. General appearance: alert, cooperative and no distress Neck: right parotid incision clean and intact, no fluid collection, drain removed, normal facial movement  Disposition: Discharge disposition: 01-Home or Self Care       Discharge Instructions    Diet - low sodium heart healthy   Complete by: As directed    Discharge instructions   Complete by: As directed    Keep diet bland.  Avoid spicy or sour.  OK to allow incision to get wet, gently pat dry.  Apply antibiotic ointment twice daily.   Increase activity slowly   Complete by: As directed      Allergies as of 02/16/2020   No Known Allergies     Medication List    TAKE these medications   fexofenadine 180 MG tablet Commonly known as: ALLEGRA Take 180 mg by mouth daily.   traMADol 50 MG tablet Commonly known as: ULTRAM Take 1-2 tablets (50-100 mg total) by mouth every 6 (six) hours as needed for moderate pain.      Follow-up Information    Melida Quitter, MD. Schedule an appointment as soon as possible for a visit in 1 week.   Specialty:  Otolaryngology Contact information: [address] [place] [address] [phone]           Signed: Melida Quitter 02/16/2020, 8:04 AM

## 2020-02-18 LAB — SURGICAL PATHOLOGY

## 2020-02-22 DIAGNOSIS — H8110 Benign paroxysmal vertigo, unspecified ear: Secondary | ICD-10-CM | POA: Insufficient documentation

## 2020-03-04 DIAGNOSIS — R42 Dizziness and giddiness: Secondary | ICD-10-CM | POA: Diagnosis not present

## 2020-03-27 DIAGNOSIS — R5383 Other fatigue: Secondary | ICD-10-CM | POA: Diagnosis not present

## 2020-03-27 DIAGNOSIS — F411 Generalized anxiety disorder: Secondary | ICD-10-CM | POA: Diagnosis not present

## 2020-03-27 DIAGNOSIS — Z13220 Encounter for screening for lipoid disorders: Secondary | ICD-10-CM | POA: Diagnosis not present

## 2020-03-27 DIAGNOSIS — Z1329 Encounter for screening for other suspected endocrine disorder: Secondary | ICD-10-CM | POA: Diagnosis not present

## 2020-03-27 DIAGNOSIS — Z1321 Encounter for screening for nutritional disorder: Secondary | ICD-10-CM | POA: Diagnosis not present

## 2020-03-27 DIAGNOSIS — I10 Essential (primary) hypertension: Secondary | ICD-10-CM | POA: Diagnosis not present

## 2020-03-27 DIAGNOSIS — Z13228 Encounter for screening for other metabolic disorders: Secondary | ICD-10-CM | POA: Diagnosis not present

## 2020-04-11 DIAGNOSIS — D352 Benign neoplasm of pituitary gland: Secondary | ICD-10-CM | POA: Diagnosis not present

## 2020-04-11 DIAGNOSIS — I10 Essential (primary) hypertension: Secondary | ICD-10-CM | POA: Diagnosis not present

## 2020-04-11 DIAGNOSIS — Z780 Asymptomatic menopausal state: Secondary | ICD-10-CM | POA: Diagnosis not present

## 2020-04-11 DIAGNOSIS — R82998 Other abnormal findings in urine: Secondary | ICD-10-CM | POA: Diagnosis not present

## 2020-07-11 DIAGNOSIS — I83813 Varicose veins of bilateral lower extremities with pain: Secondary | ICD-10-CM | POA: Diagnosis not present

## 2020-07-11 DIAGNOSIS — Z0000 Encounter for general adult medical examination without abnormal findings: Secondary | ICD-10-CM | POA: Diagnosis not present

## 2020-07-17 ENCOUNTER — Ambulatory Visit (INDEPENDENT_AMBULATORY_CARE_PROVIDER_SITE_OTHER): Payer: BC Managed Care – PPO | Admitting: Physician Assistant

## 2020-07-17 ENCOUNTER — Other Ambulatory Visit: Payer: Self-pay

## 2020-07-17 ENCOUNTER — Other Ambulatory Visit (HOSPITAL_COMMUNITY): Payer: Self-pay | Admitting: Internal Medicine

## 2020-07-17 ENCOUNTER — Ambulatory Visit (HOSPITAL_COMMUNITY)
Admission: RE | Admit: 2020-07-17 | Discharge: 2020-07-17 | Disposition: A | Discharge status: Home / Self Care | Payer: BC Managed Care – PPO | Source: Ambulatory Visit | Attending: Vascular Surgery | Admitting: Vascular Surgery

## 2020-07-17 VITALS — BP 141/92 | HR 83 | Temp 98.7°F | Resp 14 | Ht 64.0 in | Wt 199.7 lb

## 2020-07-17 DIAGNOSIS — I83893 Varicose veins of bilateral lower extremities with other complications: Secondary | ICD-10-CM

## 2020-07-17 DIAGNOSIS — I8393 Asymptomatic varicose veins of bilateral lower extremities: Secondary | ICD-10-CM

## 2020-07-17 HISTORY — DX: Asymptomatic varicose veins of bilateral lower extremities: I83.93

## 2020-07-17 NOTE — Progress Notes (Signed)
[facility]           OF [place]  History and Physical   [name] is a 54 y.o. female who presents with varicose veins with left leg worse than right.  She states that she has had varicose veins present since her last pregnancy, which was 21 years ago.  She states that her legs are tired at the end of the day.  She states they sometimes itch.  She states she does get some ankle swelling, but this improves with elevation and fluid pill and has been better lately. She has not had any bleeding episodes from her veins.  She has not tried compression socks in the past.  She does not have a family hx of varicose veins.  She does not have any skin color changes present.  She does not get cramping with walking but does get occasional cramps at night.  She has not had DVT.  She has had 2 term pregnancies and delivered by Cesarean section.   The pt is not on a statin for cholesterol management.  The pt is not on a daily aspirin.   Other AC:  none The pt is on CCB for hypertension.   The pt is not diabetic.   Tobacco hx:  former   PMH: 1.  HTN on CCB 2.  Varicose veins 3.  Seasonal allergies   Past Surgical History:  Procedure Laterality Date  . [place] and 2000  . PAROTIDECTOMY Right 02/15/2020   Procedure: PAROTIDECTOMY;  Surgeon: Melida Quitter, MD;  Location: [facility] OR;  Service: ENT;  Laterality: Right;    Social History   Socioeconomic History  . Marital status: Married    Spouse name: Not on file  . Number of children: Not on file  . Years of education: Not on file  . Highest education level: Not on file  Occupational History  . Not on file  Tobacco Use  . Smoking status: Former Research scientist (life sciences)  . Smokeless tobacco: Never Used  . Tobacco comment: teenage years only  Vaping Use  . Vaping Use: Never used  Substance and Sexual Activity  . Alcohol use: Not Currently  . Drug use: Never  . Sexual activity: Not on file  Other Topics  Concern  . Not on file  Social History Narrative  . Not on file   Social Determinants of Health   Financial Resource Strain:   . Difficulty of Paying Living Expenses: Not on file  Food Insecurity:   . Worried About Charity fundraiser in the Last Year: Not on file  . Ran Out of Food in the Last Year: Not on file  Transportation Needs:   . Lack of Transportation (Medical): Not on file  . Lack of Transportation (Non-Medical): Not on file  Physical Activity:   . Days of Exercise per Week: Not on file  . Minutes of Exercise per Session: Not on file  Stress:   . Feeling of Stress : Not on file  Social Connections:   . Frequency of Communication with Friends and Family: Not on file  . Frequency of Social Gatherings with Friends and Family: Not on file  . Attends Religious Services: Not on file  . Active Member of Clubs or Organizations: Not on file  . Attends Archivist Meetings: Not on file  . Marital Status: Not on file  Intimate Partner Violence:   .  Fear of Current or Ex-Partner: Not on file  . Emotionally Abused: Not on file  . Physically Abused: Not on file  . Sexually Abused: Not on file    Family Hx: Denies any family hx of varicose veins or AAA.  Current Outpatient Medications  Medication Sig Dispense Refill  . fexofenadine (ALLEGRA) 180 MG tablet Take 180 mg by mouth daily.    . traMADol (ULTRAM) 50 MG tablet Take 1-2 tablets (50-100 mg total) by mouth every 6 (six) hours as needed for moderate pain. 15 tablet 0   No current facility-administered medications for this visit.    No Known Allergies  REVIEW OF SYSTEMS:   [X]  denotes positive finding, [ ]  denotes negative finding Cardiac  Comments:  Chest pain or chest pressure:    Shortness of breath upon exertion:    Short of breath when lying flat:    Irregular heart rhythm:        Vascular    Pain in calf, thigh, or hip brought on by ambulation: x   Pain in feet at night that wakes you up from  your sleep:     Blood clot in your veins:    Leg swelling:  x See HPI      Pulmonary    Oxygen at home:    Productive cough:     Wheezing:         Neurologic    Sudden weakness in arms or legs:     Sudden numbness in arms or legs:     Sudden onset of difficulty speaking or slurred speech:    Temporary loss of vision in one eye:     Problems with dizziness:         Gastrointestinal    Blood in stool:     Vomited blood:         Genitourinary    Burning when urinating:     Blood in urine:        Psychiatric    Major depression:         Hematologic    Bleeding problems:    Problems with blood clotting too easily:        Skin    Rashes or ulcers:        Constitutional    Fever or chills:      PHYSICAL EXAMINATION:  Today's Vitals   07/17/20 1056  BP: (!) 141/92  Pulse: 83  Resp: 14  Temp: 98.7 F (37.1 C)  TempSrc: Temporal  SpO2: 98%  Weight: 199 lb 11.2 oz (90.6 kg)  Height: 5\' 4"  (1.626 m)  PainSc: 7    Body mass index is 34.28 kg/m.   General:  WDWN in NAD; vital signs documented above Gait: Not observed HENT: WNL, normocephalic Pulmonary: normal non-labored breathing without wheezing Cardiac: regular HR; without carotid bruits Abdomen: soft, NT, no masses; aortic pulse is not palpable Skin: without rashes Vascular Exam/Pulses:  Right Left  Radial 2+ (normal) 2+ (normal)  DP 2+ (normal) 2+ (normal)  PT Unable to palpate Unable to palpate   Extremities: without ischemic changes, without cellulitis; without open wounds; without skin color changes      Musculoskeletal: no muscle wasting or atrophy  Neurologic: A&O X 3;  moving all extremities equally Psychiatric:  The pt has Normal affect.   Non-Invasive Vascular Imaging:   Venous duplex on 07/17/2020: Venous Reflux Times  +------------------+---------+------+-----------+------------+--------+  RIGHT       Reflux NoRefluxReflux TimeDiameter cmsComments  Yes                   +------------------+---------+------+-----------+------------+--------+  CFV              yes  >1 second             +------------------+---------+------+-----------+------------+--------+  FV mid      no                         +------------------+---------+------+-----------+------------+--------+  Popliteal     no                         +------------------+---------+------+-----------+------------+--------+  GSV at SFJ          yes  >500 ms   0.72        +------------------+---------+------+-----------+------------+--------+  GSV prox thigh  no               0.41        +------------------+---------+------+-----------+------------+--------+  GSV mid thigh   no               0.40        +------------------+---------+------+-----------+------------+--------+  GSV dist thigh  no               0.40        +------------------+---------+------+-----------+------------+--------+  GSV at knee    no               0.40        +------------------+---------+------+-----------+------------+--------+  GSV prox calf   no               0.28        +------------------+---------+------+-----------+------------+--------+  SSV Pop Fossa   no               0.18        +------------------+---------+------+-----------+------------+--------+  anterior accessory      yes  >500 ms   0.26        +------------------+---------+------+-----------+------------+--------+     +--------------+---------+------+-----------+------------+--------+  LEFT     Reflux NoRefluxReflux TimeDiameter cmsComments               Yes                    +--------------+---------+------+-----------+------------+--------+  CFV            yes  >1 second             +--------------+---------+------+-----------+------------+--------+  FV mid          yes  >1 second             +--------------+---------+------+-----------+------------+--------+  Popliteal   no                         +--------------+---------+------+-----------+------------+--------+  GSV at SFJ        yes  >500 ms   2.73        +--------------+---------+------+-----------+------------+--------+  GSV prox thigh      yes         0.82        +--------------+---------+------+-----------+------------+--------+  GSV mid thigh       yes         0.85        +--------------+---------+------+-----------+------------+--------+  GSV dist thigh      yes         0.91        +--------------+---------+------+-----------+------------+--------+  GSV at knee  yes         0.75        +--------------+---------+------+-----------+------------+--------+  GSV prox calf       yes         0.72        +--------------+---------+------+-----------+------------+--------+  SSV Pop Fossa no               0.20        +--------------+---------+------+-----------+------------+--------+   Summary:  Right:  - No evidence of deep vein thrombosis from the common femoral through the  popliteal veins.  - No evidence of superficial venous thrombosis.   - The common femoral vein is not competent.  - The great saphenous vein is competent.  - The small saphenous vein is competent.  - The anterior accessory vein is not competent.    Left:  - No evidence of deep vein thrombosis from the common femoral through the popliteal veins.  -  No evidence of superficial venous thrombosis.  - The deep veins are not competent.  - The great saphenous vein is not competent.  - The small saphenous vein is competent.   Juley Giovanetti is a 54 y.o. female who presents with: varicose veins bilateral with left > right  -pt's left leg symptomatic with ankle swelling, tiredness, achiness and some itchiness.  She does have reflux throughout the left GSV as well as CFV (bilaterally).  The vein measures 0.72cm-0.91cm. -discussed with pt about wearing thigh high 20-52mmHg compression stockings and she was measured today and elevating legs.    -discussed importance of weight loss as well as water exercises. -she was given handout about varicose veins.  -pt will f/u in 3 months for further evaluation for laser ablation/stab phlebectomy of the left leg with either Dr. Scot Dock or Dr. Oneida Alar.     Leontine Locket, [facility] 07/17/2020 11:00 AM  Clinic MD:  Carlis Abbott on call MD

## 2020-07-24 ENCOUNTER — Encounter: Payer: Self-pay | Admitting: Gastroenterology

## 2020-07-25 DIAGNOSIS — I10 Essential (primary) hypertension: Secondary | ICD-10-CM | POA: Diagnosis not present

## 2020-08-13 ENCOUNTER — Other Ambulatory Visit: Payer: Self-pay

## 2020-08-13 ENCOUNTER — Encounter: Payer: Self-pay | Admitting: Dermatology

## 2020-08-13 ENCOUNTER — Ambulatory Visit (INDEPENDENT_AMBULATORY_CARE_PROVIDER_SITE_OTHER): Payer: BC Managed Care – PPO | Admitting: Dermatology

## 2020-08-13 DIAGNOSIS — Z1283 Encounter for screening for malignant neoplasm of skin: Secondary | ICD-10-CM | POA: Diagnosis not present

## 2020-08-13 DIAGNOSIS — D0439 Carcinoma in situ of skin of other parts of face: Secondary | ICD-10-CM

## 2020-08-13 DIAGNOSIS — C4492 Squamous cell carcinoma of skin, unspecified: Secondary | ICD-10-CM

## 2020-08-13 DIAGNOSIS — D485 Neoplasm of uncertain behavior of skin: Secondary | ICD-10-CM

## 2020-08-13 HISTORY — DX: Squamous cell carcinoma of skin, unspecified: C44.92

## 2020-08-13 NOTE — Patient Instructions (Signed)

## 2020-08-18 ENCOUNTER — Other Ambulatory Visit: Payer: Self-pay | Admitting: Obstetrics and Gynecology

## 2020-08-18 ENCOUNTER — Other Ambulatory Visit: Payer: Self-pay | Admitting: Internal Medicine

## 2020-08-18 ENCOUNTER — Telehealth: Payer: Self-pay | Admitting: Dermatology

## 2020-08-18 DIAGNOSIS — Z1231 Encounter for screening mammogram for malignant neoplasm of breast: Secondary | ICD-10-CM

## 2020-08-18 NOTE — Telephone Encounter (Signed)
Phone call to patient to let her know that we haven't received her biopsy results yet.

## 2020-08-18 NOTE — Telephone Encounter (Signed)
ST told her results should be in by last Friday, but they haven't shown up on mychart yet. Are they in?

## 2020-08-20 ENCOUNTER — Encounter: Payer: Self-pay | Admitting: *Deleted

## 2020-08-20 ENCOUNTER — Telehealth: Payer: Self-pay | Admitting: *Deleted

## 2020-08-20 NOTE — Telephone Encounter (Signed)
Pathology to patient, surgery scheduled  

## 2020-08-20 NOTE — Telephone Encounter (Signed)
Left message for patient to call back for results.  

## 2020-08-20 NOTE — Telephone Encounter (Signed)
-----   Message from Lavonna Monarch, MD sent at 08/20/2020  5:41 AM EDT ----- Schedule surgery with Dr. Darene Lamer

## 2020-08-29 ENCOUNTER — Ambulatory Visit (AMBULATORY_SURGERY_CENTER): Payer: Self-pay | Admitting: *Deleted

## 2020-08-29 ENCOUNTER — Other Ambulatory Visit: Payer: Self-pay

## 2020-08-29 VITALS — Ht 64.0 in | Wt 198.4 lb

## 2020-08-29 DIAGNOSIS — Z1211 Encounter for screening for malignant neoplasm of colon: Secondary | ICD-10-CM

## 2020-08-29 MED ORDER — PLENVU 140 G PO SOLR: 1.0000 | Freq: Once | ORAL | 0 refills | Status: AC

## 2020-08-29 NOTE — Progress Notes (Signed)
Completed covid vaccines 05-06-20  Pt having a squamous cell CA excision from right cheek before colonoscopy.  Pt is aware that care partner will wait in the car during procedure; if they feel like they will be too hot or cold to wait in the car; they may wait in the 4 th floor lobby. Patient is aware to bring only one care partner. We want them to wear a mask (we do not have any that we can provide them), practice social distancing, and we will check their temperatures when they get here.  I did remind the patient that their care partner needs to stay in the parking lot the entire time and have a cell phone available, we will call them when the pt is ready for discharge. Patient will wear mask into building.   No trouble with anesthesia, difficulty with intubation or hx/fam hx of malignant hyperthermia per pt   No egg or soy allergy  No home oxygen use   No medications for weight loss taken  emmi information given  Pt denies constipation issues  plenvu coupon given and code put into RX

## 2020-09-03 ENCOUNTER — Encounter: Payer: Self-pay | Admitting: Gastroenterology

## 2020-09-04 ENCOUNTER — Ambulatory Visit (INDEPENDENT_AMBULATORY_CARE_PROVIDER_SITE_OTHER): Payer: BC Managed Care – PPO | Admitting: Dermatology

## 2020-09-04 ENCOUNTER — Encounter: Payer: Self-pay | Admitting: Dermatology

## 2020-09-04 ENCOUNTER — Other Ambulatory Visit: Payer: Self-pay

## 2020-09-04 DIAGNOSIS — D049 Carcinoma in situ of skin, unspecified: Secondary | ICD-10-CM

## 2020-09-04 DIAGNOSIS — D0439 Carcinoma in situ of skin of other parts of face: Secondary | ICD-10-CM | POA: Diagnosis not present

## 2020-09-04 NOTE — Patient Instructions (Addendum)
  Please call in 2-3 weeks call the office to let us know that you are healing ok. Follow up in 3-4 months.   Biopsy, Surgery (Curettage) & Surgery (Excision) Aftercare Instructions  1. Okay to remove bandage in 24 hours  2. Wash area with soap and water  3. Apply Vaseline to area twice daily until healed (Not Neosporin)  4. Okay to cover with a Band-Aid to decrease the chance of infection or prevent irritation from clothing; also it's okay to uncover lesion at home.  5. Suture instructions: return to our office in 7-10 or 10-14 days for a nurse visit for suture removal. Variable healing with sutures, if pain or itching occurs call our office. It's okay to shower or bathe 24 hours after sutures are given.  6. The following risks may occur after a biopsy, curettage or excision: bleeding, scarring, discoloration, recurrence, infection (redness, yellow drainage, pain or swelling).  7. For questions, concerns and results call our office at [place] before 4pm & Friday before 3pm. Biopsy results will be available in 1 week.

## 2020-09-11 NOTE — Progress Notes (Signed)
   Follow-Up Visit   Subjective  [name] is a 54 y.o. female who presents for the following: Procedure (here for treatment- left buccal cheek- scc x 1).  CIS Location: Left cheek Duration:  Quality:  Associated Signs/Symptoms: Modifying Factors:  Severity:  Timing: Context: For treatment  Objective  Well appearing patient in no apparent distress; mood and affect are within normal limits.  A focused examination was performed including Head and neck.. Relevant physical exam findings are noted in the Assessment and Plan.   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   Destruction of lesion Complexity: simple   Destruction method: electrodesiccation and curettage   Informed consent: discussed and consent obtained   Timeout:  patient name, date of birth, surgical site, and procedure verified Anesthesia: the lesion was anesthetized in a standard fashion   Anesthetic:  1% lidocaine w/ epinephrine 1-100,000 local infiltration Curettage performed in three different directions: Yes   Curettage cycles:  3 Lesion length (cm):  0.8 Lesion width (cm):  0.8 Margin per side (cm):  0 Final wound size (cm):  0.8 Hemostasis achieved with:  ferric subsulfate Outcome: patient tolerated procedure well with no complications   Additional details:  Wound innoculated with 5 fluorouracil solution.     I, Lavonna Monarch, MD, have reviewed all documentation for this visit.  The documentation on 09/11/20 for the exam, diagnosis, procedures, and orders are all accurate and complete.

## 2020-09-17 ENCOUNTER — Encounter: Payer: Self-pay | Admitting: Gastroenterology

## 2020-09-17 ENCOUNTER — Other Ambulatory Visit: Payer: Self-pay

## 2020-09-17 ENCOUNTER — Ambulatory Visit (AMBULATORY_SURGERY_CENTER): Payer: BC Managed Care – PPO | Admitting: Gastroenterology

## 2020-09-17 VITALS — BP 121/79 | HR 75 | Temp 98.6°F | Resp 12 | Ht 64.0 in | Wt 198.4 lb

## 2020-09-17 DIAGNOSIS — D122 Benign neoplasm of ascending colon: Secondary | ICD-10-CM | POA: Diagnosis not present

## 2020-09-17 DIAGNOSIS — Z1211 Encounter for screening for malignant neoplasm of colon: Secondary | ICD-10-CM | POA: Diagnosis not present

## 2020-09-17 MED ORDER — SODIUM CHLORIDE 0.9 % IV SOLN: 500.0000 mL | Freq: Once | INTRAVENOUS | Status: DC

## 2020-09-17 NOTE — Op Note (Addendum)
[place] Patient Name: [name] Procedure Date: 09/17/2020 9:30 AM MRN: [id number] Endoscopist: Milus Banister , MD Age: 54 Referring MD:  Date of Birth: 02-28-1966 Gender: Female Account #: [id number] Procedure:                Colonoscopy Indications:              Screening for colorectal malignant neoplasm Medicines:                Monitored Anesthesia Care Procedure:                Pre-Anesthesia Assessment:                           - Prior to the procedure, a History and Physical                            was performed, and patient medications and                            allergies were reviewed. The patient's tolerance of                            previous anesthesia was also reviewed. The risks                            and benefits of the procedure and the sedation                            options and risks were discussed with the patient.                            All questions were answered, and informed consent                            was obtained. Prior Anticoagulants: The patient has                            taken no previous anticoagulant or antiplatelet                            agents. ASA Grade Assessment: II - A patient with                            mild systemic disease. After reviewing the risks                            and benefits, the patient was deemed in                            satisfactory condition to undergo the procedure.                           After obtaining informed consent, the colonoscope  was passed under direct vision. Throughout the                            procedure, the patient's blood pressure, pulse, and                            oxygen saturations were monitored continuously. The                            Colonoscope was introduced through the anus and                            advanced to the the cecum, identified by                            appendiceal orifice and  ileocecal valve. The                            colonoscopy was performed without difficulty. The                            patient tolerated the procedure well. The quality                            of the bowel preparation was good. The ileocecal                            valve, appendiceal orifice, and rectum were                            photographed. Scope In: 9:32:00 AM Scope Out: 9:43:52 AM Scope Withdrawal Time: 0 hours 8 minutes 18 seconds  Total Procedure Duration: 0 hours 11 minutes 52 seconds  Findings:                 A 1 mm polyp was found in the ascending colon. The                            polyp was sessile. The polyp was removed with a                            cold snare. Resection was complete, but the polyp                            tissue was not retrieved.                           Multiple small and large-mouthed diverticula were                            found in the entire colon.                           The exam was otherwise without abnormality on  direct and retroflexion views. Complications:            No immediate complications. Estimated blood loss:                            None. Estimated Blood Loss:     Estimated blood loss: none. Impression:               - One 1 mm polyp in the ascending colon, removed                            with a cold snare. Complete resection. Polyp tissue                            not retrieved.                           - Diverticulosis in the entire examined colon.                           - The examination was otherwise normal on direct                            and retroflexion views. Recommendation:           - Patient has a contact number available for                            emergencies. The signs and symptoms of potential                            delayed complications were discussed with the                            patient. Return to normal activities tomorrow.                             Written discharge instructions were provided to the                            patient.                           - Resume previous diet.                           - Continue present medications.                           - Repeat colonoscopy in 7 years for screening,                            polyp surveillance. Milus Banister, MD 09/17/2020 9:47:38 AM This report has been signed electronically.

## 2020-09-17 NOTE — Progress Notes (Signed)
Report given to PACU, vss 

## 2020-09-17 NOTE — Progress Notes (Signed)
VS taken by C.W. 

## 2020-09-17 NOTE — Patient Instructions (Signed)
Handout on polyps and diverticulosis given    YOU HAD AN ENDOSCOPIC PROCEDURE TODAY AT THE [facility]:   Refer to the procedure report that was given to you for any specific questions about what was found during the examination.  If the procedure report does not answer your questions, please call your gastroenterologist to clarify.  If you requested that your care partner not be given the details of your procedure findings, then the procedure report has been included in a sealed envelope for you to review at your convenience later.  YOU SHOULD EXPECT: Some feelings of bloating in the abdomen. Passage of more gas than usual.  Walking can help get rid of the air that was put into your GI tract during the procedure and reduce the bloating. If you had a lower endoscopy (such as a colonoscopy or flexible sigmoidoscopy) you may notice spotting of blood in your stool or on the toilet paper. If you underwent a bowel prep for your procedure, you may not have a normal bowel movement for a few days.  Please Note:  You might notice some irritation and congestion in your nose or some drainage.  This is from the oxygen used during your procedure.  There is no need for concern and it should clear up in a day or so.  SYMPTOMS TO REPORT IMMEDIATELY:   Following lower endoscopy (colonoscopy or flexible sigmoidoscopy):  Excessive amounts of blood in the stool  Significant tenderness or worsening of abdominal pains  Swelling of the abdomen that is new, acute  Fever of 100F or higher   For urgent or emergent issues, a gastroenterologist can be reached at any hour by calling [phone]. Do not use MyChart messaging for urgent concerns.    DIET:  We do recommend a small meal at first, but then you may proceed to your regular diet.  Drink plenty of fluids but you should avoid alcoholic beverages for 24 hours.  ACTIVITY:  You should plan to take it easy for the rest of today and you should NOT  DRIVE or use heavy machinery until tomorrow (because of the sedation medicines used during the test).    FOLLOW UP: Our staff will call the number listed on your records 48-72 hours following your procedure to check on you and address any questions or concerns that you may have regarding the information given to you following your procedure. If we do not reach you, we will leave a message.  We will attempt to reach you two times.  During this call, we will ask if you have developed any symptoms of COVID 19. If you develop any symptoms (ie: fever, flu-like symptoms, shortness of breath, cough etc.) before then, please call [phone].  If you test positive for Covid 19 in the 2 weeks post procedure, please call and report this information to us.    If any biopsies were taken you will be contacted by phone or by letter within the next 1-3 weeks.  Please call us at [phone] if you have not heard about the biopsies in 3 weeks.    SIGNATURES/CONFIDENTIALITY: You and/or your care partner have signed paperwork which will be entered into your electronic medical record.  These signatures attest to the fact that that the information above on your After Visit Summary has been reviewed and is understood.  Full responsibility of the confidentiality of this discharge information lies with you and/or your care-partner. 

## 2020-09-17 NOTE — Progress Notes (Signed)
Pt's states no medical or surgical changes since previsit or office visit. 

## 2020-09-18 ENCOUNTER — Telehealth: Payer: Self-pay | Admitting: Dermatology

## 2020-09-18 NOTE — Telephone Encounter (Signed)
FYI FOR ST Doin' great..there's still a spot there, but no infection or anything. She is happy with it.

## 2020-09-19 ENCOUNTER — Telehealth: Payer: Self-pay

## 2020-09-19 NOTE — Telephone Encounter (Signed)
  Follow up Call-  Call back number 09/17/2020  Post procedure Call Back phone  # [phone]  Permission to leave phone message Yes  Some recent data might be hidden     No answer and no voicemail

## 2020-09-19 NOTE — Telephone Encounter (Signed)
First attempt follow up call to pt, lm on vm 

## 2020-10-16 ENCOUNTER — Other Ambulatory Visit: Payer: Self-pay

## 2020-10-16 ENCOUNTER — Encounter: Payer: Self-pay | Admitting: Vascular Surgery

## 2020-10-16 ENCOUNTER — Ambulatory Visit (INDEPENDENT_AMBULATORY_CARE_PROVIDER_SITE_OTHER): Payer: BC Managed Care – PPO | Admitting: Vascular Surgery

## 2020-10-16 VITALS — BP 151/97 | HR 96 | Temp 98.2°F | Resp 16 | Ht 64.0 in | Wt 192.7 lb

## 2020-10-16 DIAGNOSIS — I83893 Varicose veins of bilateral lower extremities with other complications: Secondary | ICD-10-CM

## 2020-10-16 NOTE — Progress Notes (Signed)
REASON FOR VISIT:   Follow-up of chronic venous insufficiency and painful varicose veins of the left leg  MEDICAL ISSUES:   CHRONIC VENOUS INSUFFICIENCY: This patient has CEAP C3 venous disease.  We have discussed the importance of intermittent leg elevation and the proper positioning for this.  In addition I encouraged her to continue to wear her thigh-high compression stockings with a gradient of 20 to 30 mmHg.  I encouraged her to avoid prolonged sitting and standing.  We discussed the importance of exercise specifically walking and water aerobics.  In addition we discussed the importance of maintaining a healthy weight as central obesity especially increases lower extremity venous pressure.  Given that she is having persistent symptoms I think she would be a candidate for laser ablation of the left great saphenous vein with greater than 20 stab phlebectomies.  I have discussed the indications for endovenous laser ablation of the left GSV, that is to lower the pressure in the veins and potentially help relieve the symptoms from venous hypertension. I have discussed the potential complications of the procedure, including, but not limited to: bleeding, bruising, leg swelling, nerve injury, skin burns, significant pain from phlebitis, deep venous thrombosis, or failure of the vein to close.  Certainly the large dilated segment just below the saphenofemoral junction would make this more technically challenging.  Laser ablation has been shown to be effective for veins up to 2.5 cm in maximum diameter.  Thus she is approaching that limit but I think there would be some risk of this not closing by compressing the vein using Trendelenburg effectively we can effectively treat that segment although given the size.  I have also explained that venous insufficiency is a chronic disease, and that the patient is at risk for recurrent varicose veins in the future.  All of the patient's questions were encouraged and  answered. They are agreeable to proceed.   I have discussed with the patient the indications for stab phlebectomy.  I have explained to the patient that that will have small scars from the stab incisions.  I explained that the other risks include leg swelling, bruising, bleeding, and phlebitis.  All the patient's questions were encouraged and answered and they are agreeable to proceed.   HPI:   [name] is a pleasant 54 y.o. female who was seen by Liana Crocker, PA on 07/17/2020 with varicose veins bilaterally.  She had varicose veins which were significantly worse on the left side.  She did complain of some tired feeling in her legs at the end of the day.  She also had noted some ankle swelling.  She had not been wearing compression stockings.  She has no previous history of DVT.  On exam she had significant varicose veins of the left lower extremity in the anterior leg and posterior leg.  She was noted to have some reflux in the left great saphenous vein and also deep venous reflux bilaterally.  On my history, the patient describes an aching pain and heaviness in the left leg which is aggravated by standing and sitting and relieved with elevation.  Her symptoms are worse at the end of the day.  She said no previous history of DVT and no previous venous procedures.  She has been wearing her thigh-high compression stockings which helps some.  She elevates her legs which also helps some.  She has not required ibuprofen for pain.  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     fexofenadine (ALLEGRA) 180 MG tablet Take 180 mg by mouth daily. PRN     hydrochlorothiazide (HYDRODIURIL) 12.5 MG tablet 12.5 mg.      Multiple Vitamins-Minerals (MULTIVITAMIN ADULTS PO) Take by mouth daily.     Triamcinolone Acetonide (NASACORT AQ NA) Place into the nose. PRN     traMADol (ULTRAM) 50 MG tablet Take 1-2 tablets (50-100 mg total) by mouth every 6 (six) hours as needed for moderate pain. (Patient not taking: No sig reported) 15 tablet 0   No current facility-administered medications for this visit.    REVIEW OF SYSTEMS:  [X]  denotes positive finding, [ ]  denotes negative finding Cardiac  Comments:  Chest pain or chest pressure:    Shortness of breath upon exertion:    Short of breath when lying flat:    Irregular heart rhythm:        Vascular    Pain in calf, thigh, or hip brought on by ambulation:    Pain in feet at night that wakes you up from your sleep:     Blood clot in your veins:    Leg swelling:  x       Pulmonary    Oxygen at home:    Productive cough:     Wheezing:         Neurologic    Sudden weakness in arms or legs:     Sudden numbness in arms or legs:     Sudden onset of difficulty speaking or slurred speech:    Temporary loss of vision in one eye:     Problems with dizziness:         Gastrointestinal    Blood in stool:     Vomited blood:         Genitourinary    Burning when urinating:     Blood in urine:        Psychiatric    Major depression:         Hematologic    Bleeding problems:    Problems with blood clotting too easily:        Skin    Rashes or ulcers:        Constitutional    Fever or chills:     PHYSICAL  EXAM:   Vitals:   10/16/20 1323  BP: (!) 151/97  Pulse: 96  Resp: 16  Temp: 98.2 F (36.8 C)  TempSrc: Temporal  SpO2: 97%  Weight: 192 lb 11.2 oz (87.4 kg)  Height: 5\' 4"  (1.626 m)    GENERAL: The patient is a well-nourished female, in no acute distress. The vital signs are documented above. CARDIAC:  There is a regular rate and rhythm.  VASCULAR: I do not detect carotid bruits. She has palpable dorsalis pedis pulses bilaterally. The patient has significantly dilated varicose veins in the left leg as documented in the note from 07/17/2020. She has significant reflux throughout the left great saphenous vein from the saphenofemoral junction to the proximal calf.  The vein is markedly dilated.  It becomes very large in the proximal to mid thigh and also just below the saphenofemoral junction enlarges up to 2.34 cm. PULMONARY: There is good air exchange bilaterally without wheezing or rales. ABDOMEN: Soft and non-tender with normal pitched bowel sounds.  MUSCULOSKELETAL: There are no major deformities or cyanosis. NEUROLOGIC: No focal weakness or paresthesias are detected. SKIN: There are no ulcers or rashes noted. PSYCHIATRIC: The patient has a normal affect.  DATA:    VENOUS DUPLEX: I have reviewed the venous duplex scan that was done on 07/17/2020.  On the right side there was no evidence of DVT.  There was deep venous reflux involving the common femoral vein.  There was superficial venous reflux involving an anterior accessory saphenous vein which was not especially dilated.  On the left side, there was no evidence of DVT.  There was deep venous reflux involving the common femoral vein and femoral vein.  There was superficial venous reflux involving the left great saphenous vein from the saphenofemoral junction to the mid calf.  Diameters range from 0.72-0.91 cm.  Deitra Mayo [facility] [phone]

## 2020-10-21 ENCOUNTER — Encounter: Payer: Self-pay | Admitting: Vascular Surgery

## 2020-10-22 ENCOUNTER — Other Ambulatory Visit: Payer: Self-pay

## 2020-10-22 ENCOUNTER — Ambulatory Visit
Admission: RE | Admit: 2020-10-22 | Discharge: 2020-10-22 | Disposition: A | Discharge status: Home / Self Care | Payer: BC Managed Care – PPO | Source: Ambulatory Visit | Attending: Obstetrics and Gynecology | Admitting: Obstetrics and Gynecology

## 2020-10-22 DIAGNOSIS — Z1231 Encounter for screening mammogram for malignant neoplasm of breast: Secondary | ICD-10-CM

## 2020-10-23 DIAGNOSIS — R7989 Other specified abnormal findings of blood chemistry: Secondary | ICD-10-CM | POA: Diagnosis not present

## 2020-10-23 DIAGNOSIS — Z0000 Encounter for general adult medical examination without abnormal findings: Secondary | ICD-10-CM | POA: Diagnosis not present

## 2020-10-23 DIAGNOSIS — R82998 Other abnormal findings in urine: Secondary | ICD-10-CM | POA: Diagnosis not present

## 2020-10-27 ENCOUNTER — Encounter: Payer: BC Managed Care – PPO | Admitting: Dermatology

## 2020-12-08 ENCOUNTER — Encounter: Payer: Self-pay | Admitting: Dermatology

## 2020-12-08 ENCOUNTER — Other Ambulatory Visit: Payer: Self-pay

## 2020-12-08 ENCOUNTER — Ambulatory Visit (INDEPENDENT_AMBULATORY_CARE_PROVIDER_SITE_OTHER): Payer: BC Managed Care – PPO | Admitting: Dermatology

## 2020-12-08 DIAGNOSIS — Z85828 Personal history of other malignant neoplasm of skin: Secondary | ICD-10-CM

## 2020-12-08 DIAGNOSIS — R238 Other skin changes: Secondary | ICD-10-CM

## 2020-12-08 DIAGNOSIS — D229 Melanocytic nevi, unspecified: Secondary | ICD-10-CM

## 2020-12-08 DIAGNOSIS — D2239 Melanocytic nevi of other parts of face: Secondary | ICD-10-CM | POA: Diagnosis not present

## 2020-12-08 DIAGNOSIS — Z8589 Personal history of malignant neoplasm of other organs and systems: Secondary | ICD-10-CM

## 2020-12-16 ENCOUNTER — Other Ambulatory Visit: Payer: Self-pay | Admitting: *Deleted

## 2020-12-16 DIAGNOSIS — I83812 Varicose veins of left lower extremities with pain: Secondary | ICD-10-CM

## 2020-12-20 ENCOUNTER — Encounter: Payer: Self-pay | Admitting: Dermatology

## 2020-12-20 NOTE — Progress Notes (Signed)
   Follow-Up Visit   Subjective  [name] is a 55 y.o. female who presents for the following: Follow-up (3 month f/u- left buccal cheek - healing good, no concerns).  Recheck site of skin cancer left cheek and new bump over lip for past week Location:  Duration:  Quality:  Associated Signs/Symptoms: Modifying Factors:  Severity:  Timing: Context:   Objective  Well appearing patient in no apparent distress; mood and affect are within normal limits. Objective  Right Eyebrow: For millimeter flesh-colored historically stable papule compatible with a dermal nevus; there is a tiny papillomatous projection from this.  Dermoscopy shows no suggestion of skin cancer.  Objective  Left Upper Cutaneous Lip: 2 mm inflammatory papules less than 21 weeks old; patient concerned that it is another skin cancer.  Objective  Left Buccal Cheek: No sign residual skin cancer, patient states that she is pleased with cosmetic result.    A focused examination was performed including Head and neck. Relevant physical exam findings are noted in the Assessment and Plan.   Assessment & Plan    Benign mole Right Eyebrow  Offered to take confirmatory biopsy but patient is comfortable leaving this unless there is ongoing change.  Skin pimple Left Upper Cutaneous Lip  Patient told that most bumps that are only a week or so old will self resolve but that her concern is understandable, so if this has not gone away and 6 to 8 weeks she can return for reexamination.  History of squamous cell carcinoma Left Buccal Cheek  Check as needed change      I, Lavonna Monarch, MD, have reviewed all documentation for this visit.  The documentation on 12/20/20 for the exam, diagnosis, procedures, and orders are all accurate and complete.

## 2021-01-01 ENCOUNTER — Other Ambulatory Visit: Payer: Self-pay | Admitting: *Deleted

## 2021-01-01 MED ORDER — LORAZEPAM 1 MG PO TABS: ORAL_TABLET | ORAL | 0 refills | Status: DC

## 2021-01-08 ENCOUNTER — Other Ambulatory Visit: Payer: Self-pay

## 2021-01-08 ENCOUNTER — Encounter: Payer: Self-pay | Admitting: Vascular Surgery

## 2021-01-08 ENCOUNTER — Ambulatory Visit: Payer: BC Managed Care – PPO | Admitting: Vascular Surgery

## 2021-01-08 VITALS — BP 151/104 | HR 97 | Temp 98.2°F | Resp 16 | Ht 64.0 in | Wt 194.0 lb

## 2021-01-08 DIAGNOSIS — I83812 Varicose veins of left lower extremities with pain: Secondary | ICD-10-CM

## 2021-01-08 HISTORY — PX: ENDOVENOUS ABLATION SAPHENOUS VEIN W/ LASER: SUR449

## 2021-01-08 NOTE — Progress Notes (Signed)
Patient name: [name] MRN: [id number] DOB: [birth year] Sex: female  REASON FOR VISIT: For laser ablation of the left great saphenous vein with greater than 20 stabs  HPI: [name] is a 55 y.o. female who I last saw on 10/16/2020 with chronic venous insufficiency.  Patient has CEAP C3 venous disease.  She had failed conservative treatment and I felt that she was a candidate for laser ablation of the left great saphenous vein with greater than 20 stabs.  I was somewhat concerned about a large dilated segment just below the saphenofemoral junction that would make this more challenging.  Laser ablation has been shown to be effective for veins up to 2.5 cm in maximum diameter and she was approaching that limit.  I looked myself at the saphenous vein and she had reflux from the saphenofemoral junction in the proximal calf.  The vein was markedly dilated in the proximal mid thigh and just below the saphenofemoral junction measured 2.34 cm in maximum diameter.  Current Outpatient Medications  Medication Sig Dispense Refill  . amLODipine (NORVASC) 5 MG tablet Take 5 mg by mouth every morning.    . fexofenadine (ALLEGRA) 180 MG tablet Take 180 mg by mouth daily. PRN    . hydrochlorothiazide (HYDRODIURIL) 12.5 MG tablet 12.5 mg.     . LORazepam (ATIVAN) 1 MG tablet Take 1 tablet 30 minutes prior to leaving house on day of office surgery.  Bring second tablet with you to the office. 2 tablet 0  . Multiple Vitamins-Minerals (MULTIVITAMIN ADULTS PO) Take by mouth daily.    . traMADol (ULTRAM) 50 MG tablet Take 1-2 tablets (50-100 mg total) by mouth every 6 (six) hours as needed for moderate pain. 15 tablet 0  . Triamcinolone Acetonide (NASACORT AQ NA) Place into the nose. PRN     No current facility-administered medications for this visit.    PHYSICAL EXAM: Vitals:   01/08/21 1122  BP: (!) 151/104  Pulse: 97  Resp: 16  Temp: 98.2 F (36.8 C)  TempSrc: Temporal  SpO2: 97%  Weight:  88 kg  Height: 5\' 4"  (1.626 m)    PROCEDURE: Laser ablation left great saphenous vein with greater than 20 stabs  TECHNIQUE: The patient was taken to the exam room and I marked the dilated veins with the patient standing.  She was then placed supine.  I looked at the great saphenous vein and felt that I could cannulate this in the proximal calf.  The left leg was prepped and draped in usual sterile fashion.  Under ultrasound guidance, after the skin was anesthetized, I cannulated the great saphenous vein in the proximal calf with a micropuncture needle and a micropuncture sheath was introduced over a wire.  I then advanced the J-wire to the aneurysmal segment of the vein at the saphenofemoral junction.  The 65 cm sheath was advanced over the wire and the wire and dilator removed.  The end of the sheath was positioned approximately 2.5 cm distal to the saphenofemoral junction.  Laser fiber was positioned at the end of the sheath and the sheath retracted.  Tumescent anesthesia was administered circumferentially around the vein.  The patient was placed in Trendelenburg.  Laser ablation was performed of the left great saphenous vein from 2-1/2 cm distal to the saphenofemoral junction to the proximal calf.  50 J/cm were used at 52 W.  Attention was then turned to stab phlebectomies.  Approximately 25 small stab incisions were made over the marked areas.  The veins were grasped with a hook brought above the skin and then gently removed using blunt dissection with hemostats.  Pressure was applied.  A sterile dressing and pressure dressing was applied.  The patient tolerated the procedure well and will return next week for a follow-up duplex.  Deitra Mayo [facility] [place] [phone]

## 2021-01-08 NOTE — Progress Notes (Signed)
     Laser Ablation Procedure    Date: 01/08/2021   [name] DOB:[birth year]  Consent signed: Yes      Surgeon:  Gae Gallop MD   Procedure: Laser Ablation: left Greater Saphenous Vein  BP (!) 151/104 (BP Location: Left Arm, Patient Position: Sitting, Cuff Size: Large)   Pulse 97   Temp 98.2 F (36.8 C) (Temporal)   Resp 16   Ht 5\' 4"  (1.626 m)   Wt 194 lb (88 kg)   LMP 03/28/2013   SpO2 97%   BMI 33.30 kg/m   Tumescent Anesthesia: 700 cc 0.9% NaCl with 50 cc Lidocaine HCL 1%  and 15 cc 8.4% NaHCO3  Local Anesthesia: 7 cc Lidocaine HCL and NaHCO3 (ratio 2:1)  7 watts continuous mode     Total energy: 1822 Joules    Total time: 243 seconds  Treatment Length 41 cm  Laser Fiber Ref. # [id number]                              Lot # [id number]   Stab Phlebectomy: >20 Sites: Calf  Patient tolerated procedure well  Notes: Patient wore face mask.  All staff members wore facial masks and facial shields/goggles.  Mrs. Fehr took Ativan 1 mg on 01-08-2021 at 9:35 AM.  Description of Procedure:  After marking the course of the secondary varicosities, the patient was placed on the operating table in the supine position, and the left leg was prepped and draped in sterile fashion.   Local anesthetic was administered and under ultrasound guidance the saphenous vein was accessed with a micro needle and guide wire; then the mirco puncture sheath was placed.  A guide wire was inserted saphenofemoral junction , followed by a 5 french sheath.  The position of the sheath and then the laser fiber below the junction was confirmed using the ultrasound.  Tumescent anesthesia was administered along the course of the saphenous vein using ultrasound guidance. The patient was placed in Trendelenburg position and protective laser glasses were placed on patient and staff, and the laser was fired at 7 watts continuous mode for a total of 1822 joules.   For stab phlebectomies, local anesthetic was  administered at the previously marked varicosities, and tumescent anesthesia was administered around the vessels.  Greater than 20 stab wounds were made using the tip of an 11 blade. And using the vein hook, the phlebectomies were performed using a hemostat to avulse the varicosities.  Adequate hemostasis was achieved.     Steri strips were applied to the stab wounds and ABD pads and thigh high compression stockings were applied.  Ace wrap bandages were applied over the phlebectomy sites and at the top of the saphenofemoral junction. Blood loss was less than 15 cc.  Discharge instructions reviewed with patient and hardcopy of discharge instructions given to patient to take home. The patient ambulated out of the operating room having tolerated the procedure well.

## 2021-01-15 ENCOUNTER — Encounter: Payer: Self-pay | Admitting: Vascular Surgery

## 2021-01-15 ENCOUNTER — Other Ambulatory Visit: Payer: Self-pay

## 2021-01-15 ENCOUNTER — Ambulatory Visit (HOSPITAL_COMMUNITY)
Admission: RE | Admit: 2021-01-15 | Discharge: 2021-01-15 | Disposition: A | Discharge status: Home / Self Care | Payer: BC Managed Care – PPO | Source: Ambulatory Visit | Attending: Vascular Surgery | Admitting: Vascular Surgery

## 2021-01-15 ENCOUNTER — Ambulatory Visit (INDEPENDENT_AMBULATORY_CARE_PROVIDER_SITE_OTHER): Payer: BC Managed Care – PPO | Admitting: Vascular Surgery

## 2021-01-15 VITALS — BP 140/94 | HR 97 | Resp 16

## 2021-01-15 DIAGNOSIS — I8393 Asymptomatic varicose veins of bilateral lower extremities: Secondary | ICD-10-CM

## 2021-01-15 DIAGNOSIS — I83812 Varicose veins of left lower extremities with pain: Secondary | ICD-10-CM

## 2021-01-15 NOTE — Progress Notes (Signed)
Patient name: Oluchi Pucci MRN: [id number] DOB: 19-May-1966 Sex: female  REASON FOR VISIT: Follow-up after laser ablation of the left great saphenous vein and greater than 20 stab phlebectomies.  HPI: Lakima Dona is a 55 y.o. female who had presented with chronic venous insufficiency and CEAP C3 venous disease.  She had failed conservative treatment and was felt to be a good candidate for laser ablation of the left great saphenous vein with greater than 20 stab phlebectomies.  She did have a large dilated segment just near the saphenofemoral junction which measured 2.34 cm in maximum diameter.  She underwent laser ablation of the left great saphenous vein from 2.5 cm distal to the saphenofemoral junction of the proximal calf on 01/08/2021.  I administered a significant amount of tumescent anesthesia around the saphenofemoral junction to try to compress this dilated segment.  She comes in for a 1 week follow-up visit.  She has no specific complaints.  She has been gradually resuming her normal activities.  She will continue to wear her thigh-high stockings for another week.  Current Outpatient Medications  Medication Sig Dispense Refill  . amLODipine (NORVASC) 5 MG tablet Take 5 mg by mouth every morning.    . fexofenadine (ALLEGRA) 180 MG tablet Take 180 mg by mouth daily. PRN    . hydrochlorothiazide (HYDRODIURIL) 12.5 MG tablet 12.5 mg.     . LORazepam (ATIVAN) 1 MG tablet Take 1 tablet 30 minutes prior to leaving house on day of office surgery.  Bring second tablet with you to the office. 2 tablet 0  . Multiple Vitamins-Minerals (MULTIVITAMIN ADULTS PO) Take by mouth daily.    . traMADol (ULTRAM) 50 MG tablet Take 1-2 tablets (50-100 mg total) by mouth every 6 (six) hours as needed for moderate pain. 15 tablet 0  . Triamcinolone Acetonide (NASACORT AQ NA) Place into the nose. PRN     No current facility-administered medications for this visit.   REVIEW OF SYSTEMS: Valu.Nieves ] denotes  positive finding; [  ] denotes negative finding  CARDIOVASCULAR:  [ ]  chest pain   [ ]  dyspnea on exertion  [ ]  leg swelling  CONSTITUTIONAL:  [ ]  fever   [ ]  chills  PHYSICAL EXAM: Vitals:   01/15/21 1037  BP: (!) 140/94  Pulse: 97  Resp: 16  SpO2: 98%   GENERAL: The patient is a well-nourished female, in no acute distress. The vital signs are documented above. CARDIOVASCULAR: There is a regular rate and rhythm. PULMONARY: There is good air exchange bilaterally without wheezing or rales. VASCULAR: Her Steri-Strips have come off.  Her incisions all look fine.  She has mild bruising in the medial thigh. She does have some reticular veins in her lateral right thigh and would like to consider sclerotherapy.    DATA:  VENOUS DUPLEX: I have independently interpreted her venous duplex scan today.  There is no evidence of DVT in the left lower extremity.  The left great saphenous vein is closed from the saphenofemoral junction down to the proximal calf.  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   MEDICAL ISSUES:  S/P LASER ABLATION LEFT GSV WITH GREATER THAN 20 STABS: Patient is doing well status post laser ablation of the left great saphenous vein with greater than 20 stabs.  The saphenous vein is successfully closed from the saphenofemoral junction to the proximal calf.  The clot extends to the saphenofemoral junction and  therefore I recommend that she take 81 mg of aspirin daily for 6 weeks.  We also discussed importance of leg elevation and exercise.  She will continue to wear her thigh-high stocking for another week and then she would like to try a knee-high 15-20 stocking which we have fitted her for.  I will see her back as needed.  Deitra Mayo [facility] [place] [facility] [place] [phone]

## 2021-03-02 DIAGNOSIS — I10 Essential (primary) hypertension: Secondary | ICD-10-CM | POA: Diagnosis not present

## 2021-05-31 ENCOUNTER — Encounter: Payer: Self-pay | Admitting: Dermatology

## 2021-05-31 NOTE — Progress Notes (Signed)
   Follow-Up Visit   Subjective  [name] is a 55 y.o. female who presents for the following: Skin Problem (LEFT CHEEK X 1 MONTH NONHEALING).  General skin check, growth on left inner cheek Location:  Duration:  Quality:  Associated Signs/Symptoms: Modifying Factors:  Severity:  Timing: Context:   Objective  Well appearing patient in no apparent distress; mood and affect are within normal limits. Generalized, Left Upper Back, Mid Back Annual skin examination.  2 smudgy moles on back- no atypia with dermatoscope  Left Buccal Cheek Waxy pink 6 mm crust, rule out superficial carcinoma       A full examination was performed including scalp, head, eyes, ears, nose, lips, neck, chest, axillae, abdomen, back, buttocks, bilateral upper extremities, bilateral lower extremities, hands, feet, fingers, toes, fingernails, and toenails. All findings within normal limits unless otherwise noted below.  Areas beneath undergarments not fully examined.   Assessment & Plan    Encounter for screening for malignant neoplasm of skin (3) Left Upper Back; Mid Back; Generalized  Annual skin examination and patient will self examine with spouse twice annually.  Continued ultraviolet protection.  Neoplasm of uncertain behavior of skin Left Buccal Cheek  Skin / nail biopsy Type of biopsy: tangential   Informed consent: discussed and consent obtained   Timeout: patient name, date of birth, surgical site, and procedure verified   Procedure prep:  Patient was prepped and draped in usual sterile fashion (Non sterile) Prep type:  Chlorhexidine Anesthesia: the lesion was anesthetized in a standard fashion   Anesthetic:  1% lidocaine w/ epinephrine 1-100,000 local infiltration Instrument used: flexible razor blade   Outcome: patient tolerated procedure well   Post-procedure details: wound care instructions given    Specimen 1 - Surgical pathology Differential Diagnosis: bcc vs  scc Check Margins: yes      I, Lavonna Monarch, MD, have reviewed all documentation for this visit.  The documentation on 05/31/21 for the exam, diagnosis, procedures, and orders are all accurate and complete.

## 2021-07-07 DIAGNOSIS — I10 Essential (primary) hypertension: Secondary | ICD-10-CM | POA: Diagnosis not present

## 2021-07-20 DIAGNOSIS — Z1339 Encounter for screening examination for other mental health and behavioral disorders: Secondary | ICD-10-CM | POA: Diagnosis not present

## 2021-07-20 DIAGNOSIS — Z23 Encounter for immunization: Secondary | ICD-10-CM | POA: Diagnosis not present

## 2021-07-20 DIAGNOSIS — I10 Essential (primary) hypertension: Secondary | ICD-10-CM | POA: Diagnosis not present

## 2021-07-20 DIAGNOSIS — Z1331 Encounter for screening for depression: Secondary | ICD-10-CM | POA: Diagnosis not present

## 2021-07-20 DIAGNOSIS — R82998 Other abnormal findings in urine: Secondary | ICD-10-CM | POA: Diagnosis not present

## 2021-07-20 DIAGNOSIS — I83813 Varicose veins of bilateral lower extremities with pain: Secondary | ICD-10-CM | POA: Diagnosis not present

## 2021-07-20 DIAGNOSIS — Z0000 Encounter for general adult medical examination without abnormal findings: Secondary | ICD-10-CM | POA: Diagnosis not present

## 2021-09-09 NOTE — Progress Notes (Deleted)
    Subjective:    CC: R hip/thigh pain  I, Taylen Wendland, LAT, ATC, am serving as scribe for Dr. Lynne Leader.  HPI: Pt is a 55 y/o female presenting w/ c/o R hip/thigh pain x .  She locates her pain to .  Low back pain: Radiating pain: R hip mechanical symptoms: Aggravating factors: Treatments tried:   Pertinent review of Systems: ***  Relevant historical information: ***   Objective:   There were no vitals filed for this visit. General: Well Developed, well nourished, and in no acute distress.   MSK: ***  Lab and Radiology Results No results found for this or any previous visit (from the past 72 hour(s)). No results found.    Impression and Recommendations:    Assessment and Plan: 55 y.o. female with ***.  PDMP not reviewed this encounter. No orders of the defined types were placed in this encounter.  No orders of the defined types were placed in this encounter.   Discussed warning signs or symptoms. Please see discharge instructions. Patient expresses understanding.   ***

## 2021-09-10 ENCOUNTER — Ambulatory Visit: Payer: BC Managed Care – PPO | Admitting: Family Medicine

## 2021-09-17 ENCOUNTER — Ambulatory Visit: Payer: Self-pay

## 2021-09-17 ENCOUNTER — Other Ambulatory Visit: Payer: Self-pay

## 2021-09-17 ENCOUNTER — Encounter: Payer: Self-pay | Admitting: Orthopaedic Surgery

## 2021-09-17 ENCOUNTER — Ambulatory Visit (INDEPENDENT_AMBULATORY_CARE_PROVIDER_SITE_OTHER): Payer: BC Managed Care – PPO | Admitting: Orthopaedic Surgery

## 2021-09-17 DIAGNOSIS — M25551 Pain in right hip: Secondary | ICD-10-CM

## 2021-09-17 NOTE — Progress Notes (Signed)
Office Visit Note   Patient: [name]           Date of Birth: [birth year]           MRN: [id number] Visit Date: 09/17/2021              Requested by: [name], [place],  [place] [address] PCP: [name], [name]   Assessment & Plan: Visit Diagnoses:  1. Pain in right hip     Plan: Impression is right hip pain likely due to OA and possibly degenerative labral tear.  We we will make a referral to Dr. Ernestina Patches for intra-articular cortisone injection.  She will also back off on activity for the next 6 weeks.  I have instructed her to let me know if she continues to have symptoms after 6 weeks so that we can obtain an MR arthrogram to rule out structural abnormalities.  Follow-Up Instructions: No follow-ups on file.   Orders:  Orders Placed This Encounter  Procedures   XR HIP UNILAT W OR W/O PELVIS 2-3 VIEWS RIGHT   Ambulatory referral to Physical Medicine Rehab   No orders of the defined types were placed in this encounter.     Procedures: No procedures performed   Clinical Data: No additional findings.   Subjective: Chief Complaint  Patient presents with   Right Hip - Pain    HPI  [name] is a very pleasant 55 year old female who knows Catherine Eastern RN who is currently a hospice nurse for patient's mother.  She has had right hip and thigh and groin pain for about 3 weeks with locking sensation.  The same thing happened back in 2017 and this resolved after physical therapy and cortisone injection.  She denies any back symptoms or radicular symptoms.  Denies any numbness and tingling.  Denies any injuries.  Review of Systems  Constitutional: Negative.   HENT: Negative.    Eyes: Negative.   Respiratory: Negative.    Cardiovascular: Negative.   Endocrine: Negative.   Musculoskeletal: Negative.   Neurological: Negative.   Hematological: Negative.   Psychiatric/Behavioral: Negative.    All other systems reviewed and are  negative.   Objective: Vital Signs: LMP 03/28/2013   Physical Exam Vitals and nursing note reviewed.  Constitutional:      Appearance: She is well-developed.  Pulmonary:     Effort: Pulmonary effort is normal.  Skin:    General: Skin is warm.     Capillary Refill: Capillary refill takes less than 2 seconds.  Neurological:     Mental Status: She is alert and oriented to person, place, and time.  Psychiatric:        Behavior: Behavior normal.        Thought Content: Thought content normal.        Judgment: Judgment normal.    Ortho Exam  Right hip examination shows pain with external rotation with logroll positive Stinchfield and positive FADIR at 15 degrees.  Negative straight leg raise.  Lateral hip is nontender.  Low back is nontender.  Specialty Comments:  No specialty comments available.  Imaging: XR HIP UNILAT W OR W/O PELVIS 2-3 VIEWS RIGHT  Result Date: 09/17/2021 Mild osteoarthritis.  No significant joint space narrowing.    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   Chronic pansinusitis 11/05/2019   Perennial allergic rhinitis 11/05/2019   Impacted cerumen  of both ears 12/18/2018   Past Medical History:  Diagnosis Date   Allergy    Hypertension    Squamous cell carcinoma of skin 08/13/2020   in situ-left buccal cheek(CX35FU)   Varicose veins of both lower extremities 07/17/2020    Family History  Problem Relation Age of Onset   Colon cancer Neg Hx    Esophageal cancer Neg Hx    Rectal cancer Neg Hx    Stomach cancer Neg Hx     Past Surgical History:  Procedure Laterality Date   CESAREAN SECTION     1995 and 2000   ENDOVENOUS ABLATION SAPHENOUS VEIN W/ LASER Left 01/08/2021   endovenous laser ablation left greater saphenous vein and stab phlebectomy > 20 incisions left leg by Gae Gallop MD    PAROTIDECTOMY Right 02/15/2020    Procedure: Lindell Spar;  Surgeon: Melida Quitter, MD;  [facility];  Service: ENT;  Laterality: Right;   Social History   Occupational History   Not on file  Tobacco Use   Smoking status: Former    Types: Cigarettes   Smokeless tobacco: Never   Tobacco comments:    teenage years only  Vaping Use   Vaping Use: Never used  Substance and Sexual Activity   Alcohol use: Yes    Comment: rarely   Drug use: Never   Sexual activity: Not on file

## 2021-09-22 ENCOUNTER — Other Ambulatory Visit: Payer: Self-pay | Admitting: Obstetrics and Gynecology

## 2021-09-22 DIAGNOSIS — Z1231 Encounter for screening mammogram for malignant neoplasm of breast: Secondary | ICD-10-CM

## 2021-09-22 DIAGNOSIS — M810 Age-related osteoporosis without current pathological fracture: Secondary | ICD-10-CM | POA: Insufficient documentation

## 2021-09-22 DIAGNOSIS — M8580 Other specified disorders of bone density and structure, unspecified site: Secondary | ICD-10-CM | POA: Insufficient documentation

## 2021-09-22 IMAGING — MG DIGITAL SCREENING BILAT W/ TOMO W/ CAD
8 series · 8 of 24 positions shown · non-contrast
Comparison: Previous exam(s).

CLINICAL DATA: Screening.

EXAM:
DIGITAL SCREENING BILATERAL MAMMOGRAM WITH TOMO AND CAD

[R MLO synth-2D]
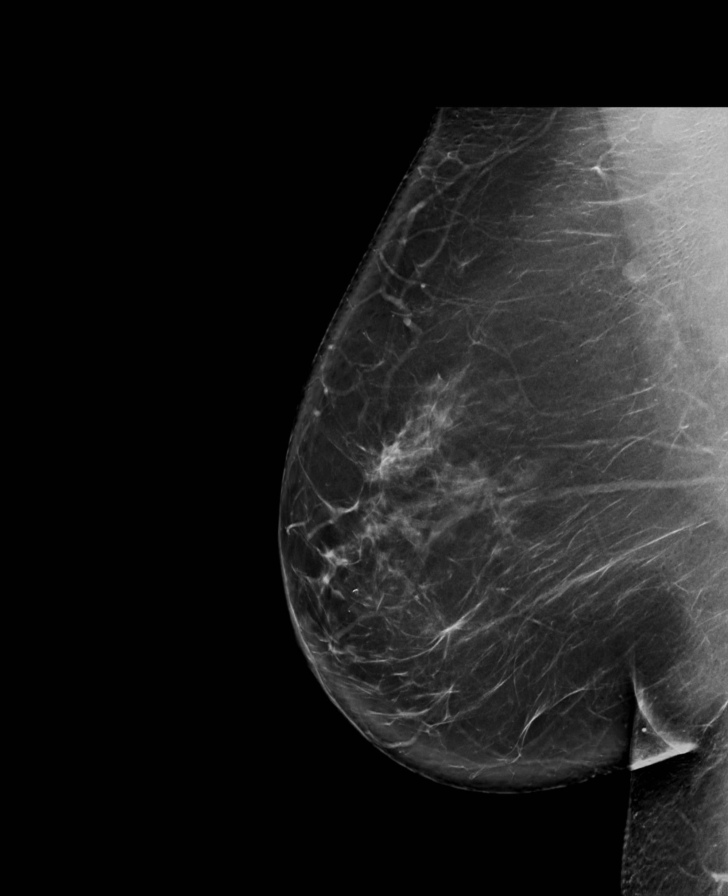

[L CC synth-2D]
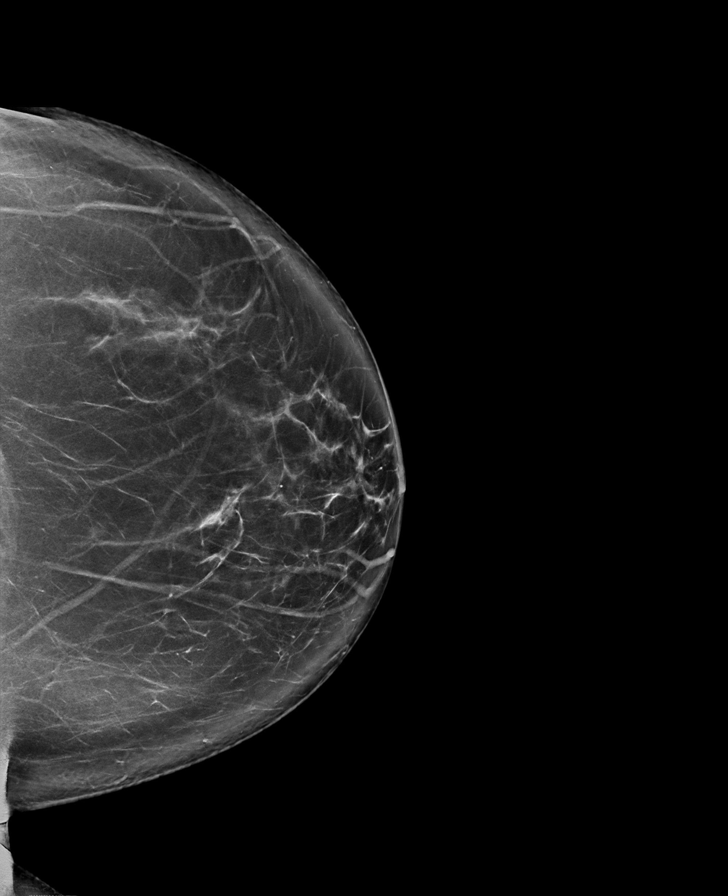

[R CC synth-2D]
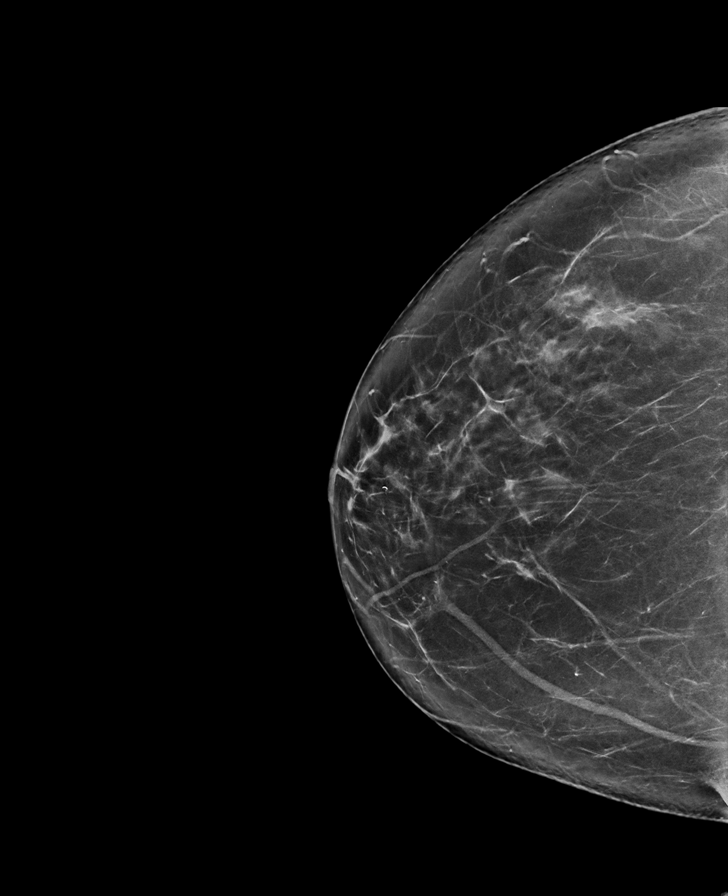

[L MLO synth-2D]
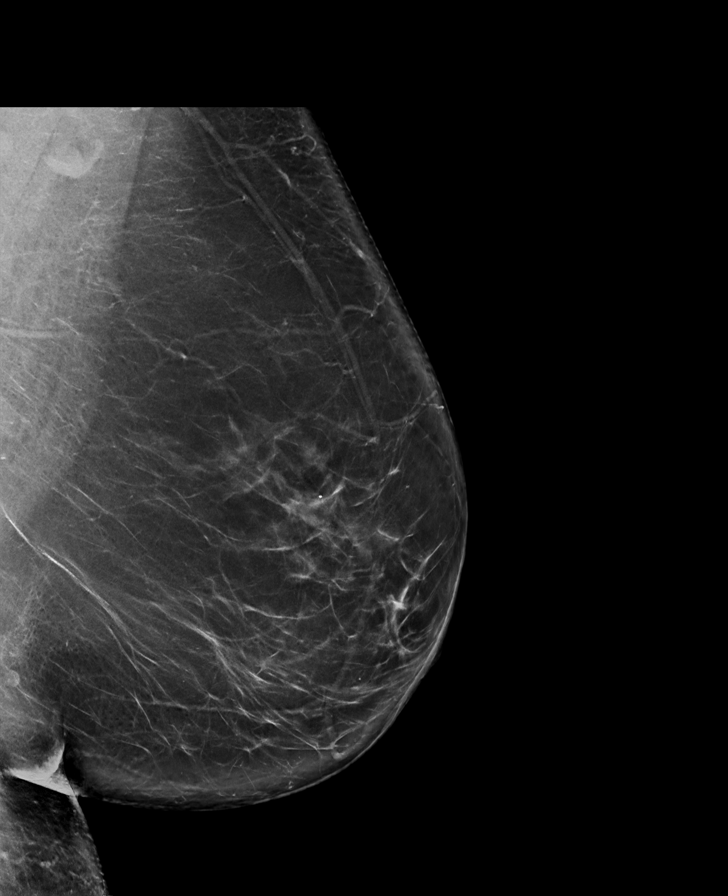

[L MLO tomo · tomo slice 49/98.0]
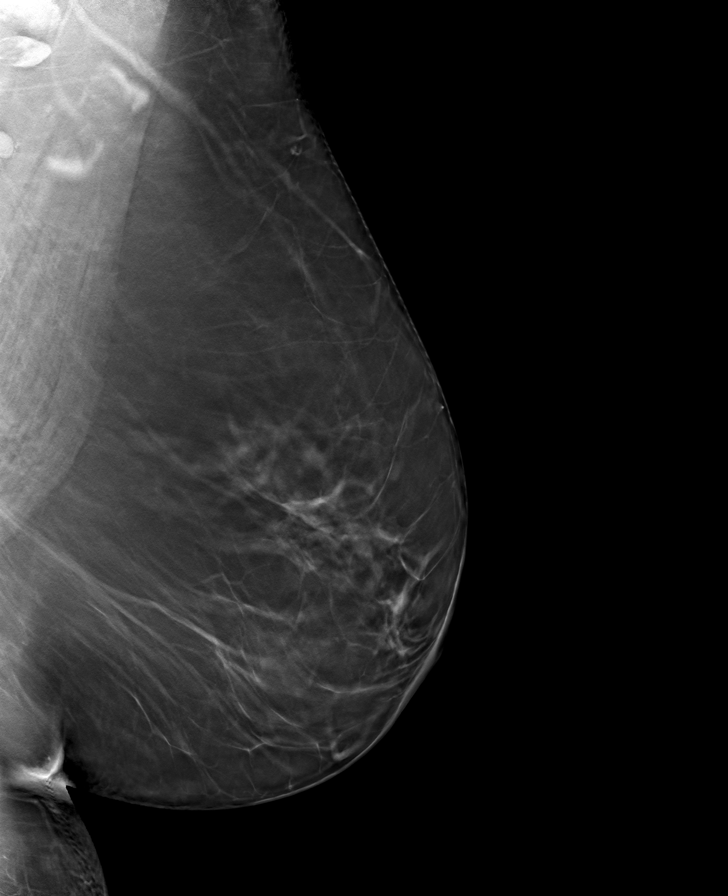

[R CC tomo · tomo slice 41/81.0]
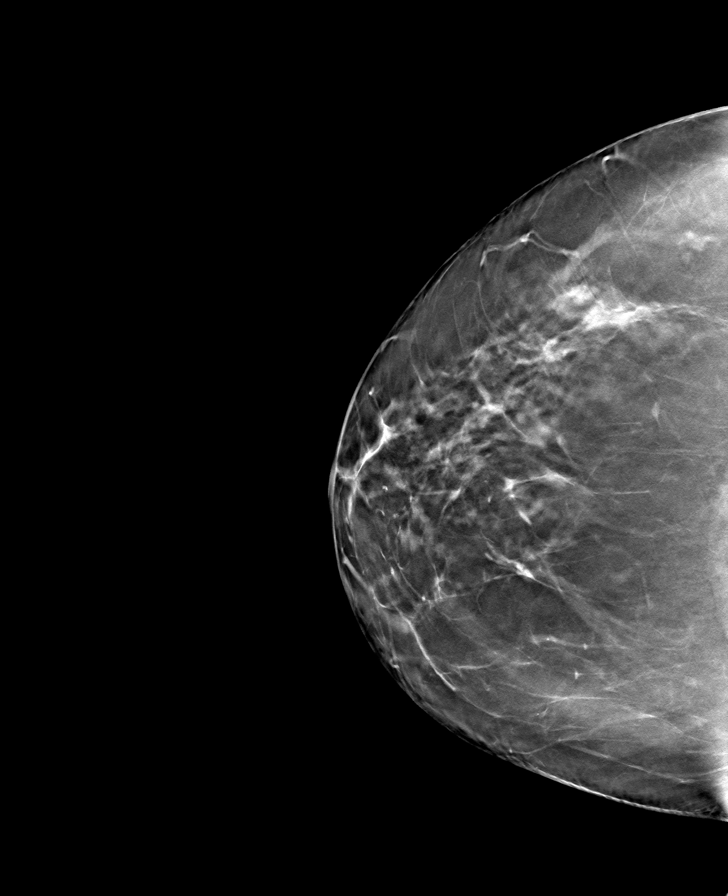

[L CC tomo · tomo slice 46/91.0]
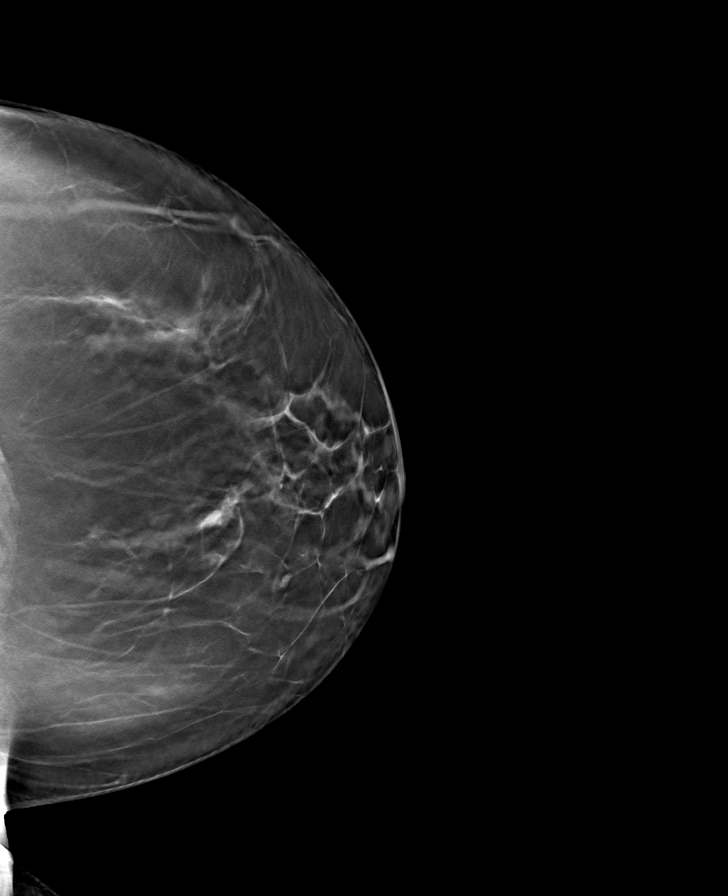

[R MLO tomo · tomo slice 51/100.0]
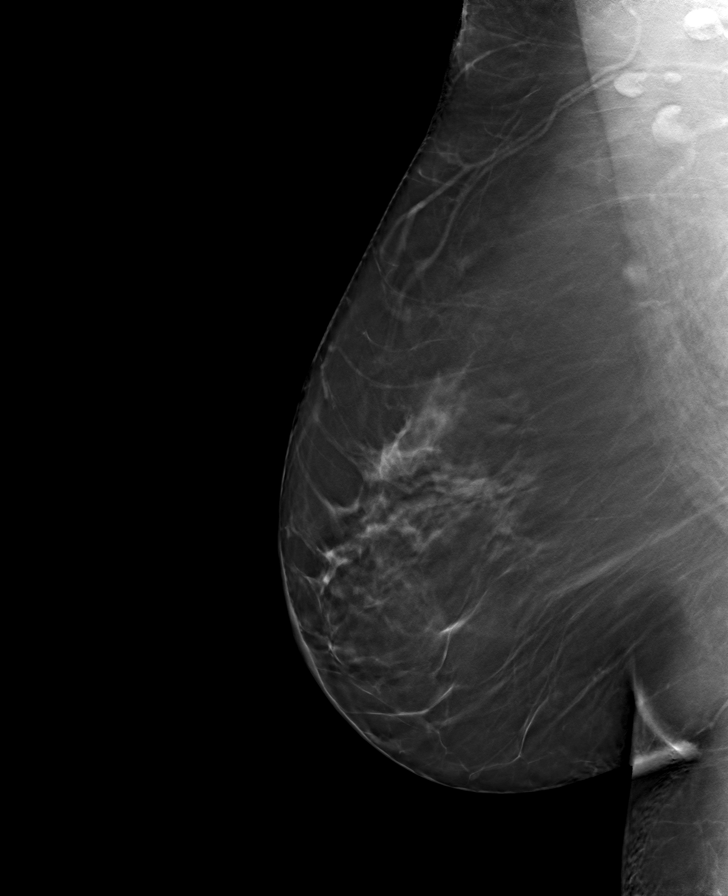

[8 of 24 positions shown; findings below may reference images not displayed]

ACR Breast Density Category b: There are scattered areas of
fibroglandular density.
FINDINGS: There are no findings suspicious for malignancy. Images were
processed with CAD.
IMPRESSION: No mammographic evidence of malignancy. A result letter of this
screening mammogram will be mailed directly to the patient.

RECOMMENDATION:
Screening mammogram in one year. (Code:[id number]-U-775)

BI-RADS CATEGORY  1: Negative.

## 2021-10-05 ENCOUNTER — Other Ambulatory Visit: Payer: Self-pay

## 2021-10-05 ENCOUNTER — Ambulatory Visit: Payer: Self-pay

## 2021-10-05 ENCOUNTER — Encounter: Payer: Self-pay | Admitting: Physical Medicine and Rehabilitation

## 2021-10-05 ENCOUNTER — Ambulatory Visit (INDEPENDENT_AMBULATORY_CARE_PROVIDER_SITE_OTHER): Payer: BC Managed Care – PPO | Admitting: Physical Medicine and Rehabilitation

## 2021-10-05 VITALS — Ht 64.0 in | Wt 195.0 lb

## 2021-10-05 DIAGNOSIS — M25551 Pain in right hip: Secondary | ICD-10-CM

## 2021-10-05 MED ORDER — BUPIVACAINE HCL 0.25 % IJ SOLN
4.0000 mL | INTRAMUSCULAR | Status: AC | PRN
  Administered 2021-10-05: 4 mL via INTRA_ARTICULAR

## 2021-10-05 MED ORDER — TRIAMCINOLONE ACETONIDE 40 MG/ML IJ SUSP
60.0000 mg | INTRAMUSCULAR | Status: AC | PRN
  Administered 2021-10-05: 60 mg via INTRA_ARTICULAR

## 2021-10-05 NOTE — Progress Notes (Signed)
   Media Pizzini - 55 y.o. female MRN [id number]  Date of birth: June 29, 1966  Office Visit Note: Visit Date: 10/05/2021 PCP: Sueanne Margarita, DO Referred by: Sueanne Margarita, DO  Subjective: Chief Complaint  Patient presents with   Right Hip - Pain   HPI:  Arelly Whittenberg is a 55 y.o. female who comes in today at the request of Dr. Eduard Roux for planned Right anesthetic hip arthrogram with fluoroscopic guidance.  The patient has failed conservative care including home exercise, medications, time and activity modification.  This injection will be diagnostic and hopefully therapeutic.  Please see requesting physician notes for further details and justification.  I did inform the patient that Dr. Erlinda Hong her to see how 6 weeks of conservative treatment along with the injection helped.  If it does not help they are looking at MRI arthrogram of the hip.  ROS Otherwise per HPI.  Assessment & Plan: Visit Diagnoses:    ICD-10-CM   1. Pain in right hip  M25.551 Large Joint Inj: R hip joint    XR C-ARM NO REPORT      Plan: No additional findings.   Meds & Orders: No orders of the defined types were placed in this encounter.   Orders Placed This Encounter  Procedures   Large Joint Inj: R hip joint   XR C-ARM NO REPORT    Follow-up: Return for visit to requesting provider as needed.   Procedures: Large Joint Inj: R hip joint on 10/05/2021 9:29 AM Indications: diagnostic evaluation and pain Details: 22 G 3.5 in needle, fluoroscopy-guided anterior approach  Arthrogram: No  Medications: 4 mL bupivacaine 0.25 %; 60 mg triamcinolone acetonide 40 MG/ML Outcome: tolerated well, no immediate complications  There was excellent flow of contrast producing a partial arthrogram of the hip. The patient did have relief of symptoms during the anesthetic phase of the injection. Procedure, treatment alternatives, risks and benefits explained, specific risks discussed. Consent was given by the patient.  Immediately prior to procedure a time out was called to verify the correct patient, procedure, equipment, support staff and site/side marked as required. Patient was prepped and draped in the usual sterile fashion.         Clinical History: No specialty comments available.     Objective:  VS:  HT:5\' 4"  (162.6 cm)   WT:195 lb (88.5 kg)  BMI:33.46    BP:   HR: bpm  TEMP: ( )  RESP:  Physical Exam   Imaging: No results found.

## 2021-10-05 NOTE — Progress Notes (Signed)
Patient is here for right hip injection. Last injection was done in 2017 with good relief. If she goes into a store and pushes a cart, by the time that she gets done, she can hardly walk. She feels like the leg is going to collapse, is stiff, and locks up.  She is taking aleve for pain.   Numeric Pain Rating Scale and Functional Assessment Average Pain 2   In the last MONTH (on 0-10 scale) has pain interfered with the following?  1. General activity like being  able to carry out your everyday physical activities such as walking, climbing stairs, carrying groceries, or moving a chair?  Rating(8)   -BT, -Dye Allergies.

## 2021-10-29 ENCOUNTER — Ambulatory Visit
Admission: RE | Admit: 2021-10-29 | Discharge: 2021-10-29 | Disposition: A | Discharge status: Home / Self Care | Payer: BC Managed Care – PPO | Source: Ambulatory Visit | Attending: Obstetrics and Gynecology | Admitting: Obstetrics and Gynecology

## 2021-10-29 ENCOUNTER — Other Ambulatory Visit: Payer: Self-pay

## 2021-10-29 DIAGNOSIS — Z1231 Encounter for screening mammogram for malignant neoplasm of breast: Secondary | ICD-10-CM | POA: Diagnosis not present

## 2021-11-05 DIAGNOSIS — Z13820 Encounter for screening for osteoporosis: Secondary | ICD-10-CM | POA: Diagnosis not present

## 2021-11-17 DIAGNOSIS — M810 Age-related osteoporosis without current pathological fracture: Secondary | ICD-10-CM | POA: Diagnosis not present

## 2021-12-14 ENCOUNTER — Ambulatory Visit (INDEPENDENT_AMBULATORY_CARE_PROVIDER_SITE_OTHER): Payer: BC Managed Care – PPO | Admitting: Dermatology

## 2021-12-14 ENCOUNTER — Other Ambulatory Visit: Payer: Self-pay

## 2021-12-14 DIAGNOSIS — D1801 Hemangioma of skin and subcutaneous tissue: Secondary | ICD-10-CM

## 2021-12-14 DIAGNOSIS — L821 Other seborrheic keratosis: Secondary | ICD-10-CM | POA: Diagnosis not present

## 2021-12-14 DIAGNOSIS — Z1283 Encounter for screening for malignant neoplasm of skin: Secondary | ICD-10-CM | POA: Diagnosis not present

## 2021-12-14 DIAGNOSIS — Z85828 Personal history of other malignant neoplasm of skin: Secondary | ICD-10-CM

## 2021-12-14 DIAGNOSIS — L918 Other hypertrophic disorders of the skin: Secondary | ICD-10-CM | POA: Diagnosis not present

## 2021-12-17 ENCOUNTER — Encounter: Payer: Self-pay | Admitting: Dermatology

## 2021-12-17 DIAGNOSIS — Z124 Encounter for screening for malignant neoplasm of cervix: Secondary | ICD-10-CM | POA: Diagnosis not present

## 2021-12-17 DIAGNOSIS — Z1151 Encounter for screening for human papillomavirus (HPV): Secondary | ICD-10-CM | POA: Diagnosis not present

## 2021-12-17 DIAGNOSIS — Z01419 Encounter for gynecological examination (general) (routine) without abnormal findings: Secondary | ICD-10-CM | POA: Diagnosis not present

## 2021-12-17 DIAGNOSIS — Z6835 Body mass index (BMI) 35.0-35.9, adult: Secondary | ICD-10-CM | POA: Diagnosis not present

## 2021-12-17 NOTE — Progress Notes (Signed)
° °  Follow-Up Visit   Subjective  Nakiya Rallis is a 56 y.o. female who presents for the following: Annual Exam (No concerns. Personal history of scc).  Check site of skin cancer, general skin check  Follow-Up Visit   Subjective  Kailynn Satterly is a 56 y.o. female who presents for the following: Annual Exam (No concerns. Personal history of scc).  Areas beneath undergarments not fully examined Location:  Duration:  Quality:  Associated Signs/Symptoms: Modifying Factors:  Severity:  Timing: Context:   Objective  Well appearing patient in no apparent distress; mood and affect are within normal limits. Scalp General skin examination, no atypical pigmented lesions or new nonmelanoma skin cancer.  Left Thigh - Anterior, Right Abdomen (side) - Upper Smooth with 2 mm red dermal papules  Left Abdomen (side) - Upper Pedunculated 2 mm flesh-colored papule  Head - Anterior (Face) No sign of residual carcinoma, patient pleased with aesthetic result.    A full examination was performed including scalp, head, eyes, ears, nose, lips, neck, chest, axillae, abdomen, back, buttocks, bilateral upper extremities, bilateral lower extremities, hands, feet, fingers, toes, fingernails, and toenails. All findings within normal limits unless otherwise noted below.  He is beneath undergarments not fully examined.   Assessment & Plan    Encounter for screening for malignant neoplasm of skin Scalp  Annual skin examination.  Hemangioma of skin (2) Left Thigh - Anterior; Right Abdomen (side) - Upper  No intervention necessary  Seborrheic keratosis Left Thigh - Posterior  Skin tag Left Abdomen (side) - Upper  No intervention necessary  Personal history of skin cancer Head - Anterior (Face)  Recheck as needed change      I, Lavonna Monarch, MD, have reviewed all documentation for this visit.  The documentation on 12/17/21 for the exam, diagnosis, procedures, and orders  are all accurate and complete. Location:  Duration:  Quality:  Associated Signs/Symptoms: Modifying Factors:  Severity:  Timing: Context:   Objective  Well appearing patient in no apparent distress; mood and affect are within normal limits. Scalp General skin examination, no atypical pigmented lesions or new nonmelanoma skin cancer.  Left Thigh - Anterior, Right Abdomen (side) - Upper Smooth with 2 mm red dermal papules  Left Abdomen (side) - Upper Pedunculated 2 mm flesh-colored papule  Head - Anterior (Face) No sign of residual carcinoma, patient pleased with aesthetic result.    A full examination was performed including scalp, head, eyes, ears, nose, lips, neck, chest, axillae, abdomen, back, buttocks, bilateral upper extremities, bilateral lower extremities, hands, feet, fingers, toes, fingernails, and toenails. All findings within normal limits unless otherwise noted below.   Assessment & Plan    Encounter for screening for malignant neoplasm of skin Scalp  Annual skin examination.  Hemangioma of skin (2) Left Thigh - Anterior; Right Abdomen (side) - Upper  No intervention necessary  Seborrheic keratosis Left Thigh - Posterior  Skin tag Left Abdomen (side) - Upper  No intervention necessary  Personal history of skin cancer Head - Anterior (Face)  Recheck as needed change      I, Lavonna Monarch, MD, have reviewed all documentation for this visit.  The documentation on 12/17/21 for the exam, diagnosis, procedures, and orders are all accurate and complete.

## 2022-02-03 DIAGNOSIS — I10 Essential (primary) hypertension: Secondary | ICD-10-CM | POA: Diagnosis not present

## 2022-02-18 DIAGNOSIS — E785 Hyperlipidemia, unspecified: Secondary | ICD-10-CM | POA: Diagnosis not present

## 2022-03-17 ENCOUNTER — Telehealth: Payer: Self-pay

## 2022-03-17 NOTE — Telephone Encounter (Signed)
Pt is calling requesting to see Dr. Quay Burow as a new pt. Pt has some friend that are Dr. Quay Burow pt and stated that they asked.  ? ?I advised pt I would call her back to schedule upon approval. ? ?Please advise ?

## 2022-03-18 NOTE — Telephone Encounter (Signed)
I think she may be a friend of Levada Dy  - I just saw her daughter.   I wll accept.   Schedule 11 am

## 2022-03-18 NOTE — Telephone Encounter (Signed)
Appointment made

## 2022-03-19 NOTE — Telephone Encounter (Signed)
Paperwork mailed to physical address today:  [address] [place], [place] [address]

## 2022-03-19 NOTE — Telephone Encounter (Signed)
Mailed out today

## 2022-04-13 ENCOUNTER — Encounter: Payer: Self-pay | Admitting: Internal Medicine

## 2022-04-13 NOTE — Progress Notes (Signed)
      Subjective:    Patient ID: [name], female    DOB: 1965-12-25, 56 y.o.   MRN: [id number]     HPI She is here to establish with a new pcp.  Zamirah is here for follow up of her chronic medical problems, including osteopenia, allergic rhinitis    Medications and allergies reviewed with patient and updated if appropriate.  Current Outpatient Medications on File Prior to Visit  Medication Sig Dispense Refill   Cholecalciferol (VITAMIN D) 50 MCG (2000 UT) CAPS      alendronate (FOSAMAX) 70 MG tablet Fosamax 70 mg tablet  Take 1 tablet every week by oral route for 30 days.  take first thing in the morning on an empty stomack with full glass of water.  Do not eat or drink anything else for 30 minutes.  Do not lay down or bend over for 30 minutes.     amLODipine (NORVASC) 5 MG tablet Take 5 mg by mouth every morning.     fexofenadine (ALLEGRA) 180 MG tablet Take 180 mg by mouth daily. PRN     hydrochlorothiazide (HYDRODIURIL) 12.5 MG tablet 12.5 mg.      Multiple Vitamins-Minerals (MULTIVITAMIN ADULTS PO) Take by mouth daily.     No current facility-administered medications on file prior to visit.     Review of Systems     Objective:  There were no vitals filed for this visit. BP Readings from Last 3 Encounters:  01/15/21 (!) 140/94  01/08/21 (!) 151/104  10/16/20 (!) 151/97   Wt Readings from Last 3 Encounters:  10/05/21 195 lb (88.5 kg)  01/08/21 194 lb (88 kg)  10/16/20 192 lb 11.2 oz (87.4 kg)   There is no height or weight on file to calculate BMI.    Physical Exam     Lab Results  Component Value Date   WBC 7.4 02/13/2020   HGB 14.8 02/13/2020   HCT 45.9 02/13/2020   PLT 289 02/13/2020   GLUCOSE 110 (H) 02/13/2020   NA 140 02/13/2020   K 4.0 02/13/2020   CL 104 02/13/2020   CREATININE 0.75 02/13/2020   BUN 14 02/13/2020   CO2 25 02/13/2020     Assessment & Plan:    See Problem List for Assessment and Plan of chronic medical problems.

## 2022-04-14 ENCOUNTER — Encounter: Payer: Self-pay | Admitting: Internal Medicine

## 2022-04-14 ENCOUNTER — Ambulatory Visit (INDEPENDENT_AMBULATORY_CARE_PROVIDER_SITE_OTHER): Payer: BC Managed Care – PPO | Admitting: Internal Medicine

## 2022-04-14 DIAGNOSIS — D110 Benign neoplasm of parotid gland: Secondary | ICD-10-CM

## 2022-04-14 DIAGNOSIS — M816 Localized osteoporosis [Lequesne]: Secondary | ICD-10-CM | POA: Diagnosis not present

## 2022-04-14 DIAGNOSIS — J3089 Other allergic rhinitis: Secondary | ICD-10-CM

## 2022-04-14 DIAGNOSIS — I10 Essential (primary) hypertension: Secondary | ICD-10-CM

## 2022-04-14 NOTE — Assessment & Plan Note (Signed)
Chronic Taking allergra daily

## 2022-04-14 NOTE — Assessment & Plan Note (Addendum)
Chronic BP well controlled Continue amlodipine 5 mg daily, hctz 6.25 mg daily Has had low potassium - increased high potassium foods BMP Q 3 months

## 2022-04-14 NOTE — Assessment & Plan Note (Addendum)
Chronic Managed by Dr Radene Knee  On fosamax 70 mg daily Taking calcium, vitamin d and vitamin K Exercising regularly

## 2022-04-14 NOTE — Patient Instructions (Addendum)
     Blood work was ordered for every 3 months.     Medications changes include :   none     Return for Physical Exam.

## 2022-05-20 ENCOUNTER — Other Ambulatory Visit (INDEPENDENT_AMBULATORY_CARE_PROVIDER_SITE_OTHER): Payer: BC Managed Care – PPO

## 2022-05-20 DIAGNOSIS — I10 Essential (primary) hypertension: Secondary | ICD-10-CM

## 2022-05-20 LAB — BASIC METABOLIC PANEL
BUN: 16 mg/dL (ref 6–23)
CO2: 30 mEq/L (ref 19–32)
Calcium: 9.5 mg/dL (ref 8.4–10.5)
Chloride: 100 mEq/L (ref 96–112)
Creatinine, Ser: 0.74 mg/dL (ref 0.40–1.20)
GFR: 89.52 mL/min (ref >60.00)
Glucose, Bld: 103 mg/dL — ABNORMAL HIGH (ref 70–99)
Potassium: 3.5 mEq/L (ref 3.5–5.1)
Sodium: 139 mEq/L (ref 135–145)

## 2022-09-02 ENCOUNTER — Other Ambulatory Visit: Payer: Self-pay

## 2022-09-02 ENCOUNTER — Telehealth: Payer: Self-pay | Admitting: Internal Medicine

## 2022-09-02 MED ORDER — AMLODIPINE BESYLATE 5 MG PO TABS: 5.0000 mg | ORAL_TABLET | Freq: Every morning | ORAL | 2 refills | Status: DC

## 2022-09-02 NOTE — Telephone Encounter (Signed)
Patient needs her amlodopine refilled - please send to [facility] in [place].  Patient only has 4 left.

## 2022-09-02 NOTE — Telephone Encounter (Signed)
Sent in today 

## 2022-09-14 ENCOUNTER — Other Ambulatory Visit (INDEPENDENT_AMBULATORY_CARE_PROVIDER_SITE_OTHER): Payer: BC Managed Care – PPO

## 2022-09-14 DIAGNOSIS — I10 Essential (primary) hypertension: Secondary | ICD-10-CM

## 2022-09-14 LAB — BASIC METABOLIC PANEL
BUN: 18 mg/dL (ref 6–23)
CO2: 28 mEq/L (ref 19–32)
Calcium: 9.7 mg/dL (ref 8.4–10.5)
Chloride: 101 mEq/L (ref 96–112)
Creatinine, Ser: 0.76 mg/dL (ref 0.40–1.20)
GFR: 86.51 mL/min (ref >60.00)
Glucose, Bld: 107 mg/dL — ABNORMAL HIGH (ref 70–99)
Potassium: 3.9 mEq/L (ref 3.5–5.1)
Sodium: 137 mEq/L (ref 135–145)

## 2022-09-30 ENCOUNTER — Encounter: Payer: Self-pay | Admitting: Internal Medicine

## 2022-09-30 NOTE — Progress Notes (Signed)
Virtual Visit via Video Note  I connected with [name] on 09/30/22 at  8:30 AM EST by a video enabled telemedicine application and verified that I am speaking with the correct person using two identifiers.   I discussed the limitations of evaluation and management by telemedicine and the availability of in person appointments. The patient expressed understanding and agreed to proceed.  Present for the visit:  Myself, Dr Billey Gosling, Louie Bun.  The patient is currently at home and I am in the office.    No referring provider.    History of Present Illness: This is an acute visit for rash.   Has redness, dryness and itchiness for several months on b/l upper lids.  It did peel at one point.  She stopped wearing eye shadow a couple of months ago.  There has been a little improvement, but it is not resolving.  She does have a dermatology appointment in 2 months, but did not want to wait that long.  She denies other skin issues or rashes elsewhere.  There is no eye involvement.  She had been using a new eye shadow, but stopping it still did not resolve the rash.     Social History   Socioeconomic History   Marital status: Married    Spouse name: Not on file   Number of children: Not on file   Years of education: Not on file   Highest education level: Not on file  Occupational History   Not on file  Tobacco Use   Smoking status: Former    Types: Cigarettes   Smokeless tobacco: Never   Tobacco comments:    teenage years only  Vaping Use   Vaping Use: Never used  Substance and Sexual Activity   Alcohol use: Yes    Comment: rarely   Drug use: Never   Sexual activity: Not on file  Other Topics Concern   Not on file  Social History Narrative   Not on file   Social Determinants of Health   Financial Resource Strain: Not on file  Food Insecurity: Not on file  Transportation Needs: Not on file  Physical Activity: Not on file  Stress: Not on file  Social  Connections: Not on file     Observations/Objective: Appears well in NAD Difficult to see, but bilateral upper eyelids there is some dryness and mild erythema-no significant swelling No conjunctival erythema or eye involvement, no lower eyelid involvement, no rash on face  Assessment and Plan:  See Problem List for Assessment and Plan of chronic medical problems.   Follow Up Instructions:    I discussed the assessment and treatment plan with the patient. The patient was provided an opportunity to ask questions and all were answered. The patient agreed with the plan and demonstrated an understanding of the instructions.   The patient was advised to call back or seek an in-person evaluation if the symptoms worsen or if the condition fails to improve as anticipated.    Binnie Rail, MD

## 2022-10-01 ENCOUNTER — Telehealth (INDEPENDENT_AMBULATORY_CARE_PROVIDER_SITE_OTHER): Payer: BC Managed Care – PPO | Admitting: Internal Medicine

## 2022-10-01 DIAGNOSIS — H01139 Eczematous dermatitis of unspecified eye, unspecified eyelid: Secondary | ICD-10-CM | POA: Diagnosis not present

## 2022-10-01 MED ORDER — TACROLIMUS 0.1 % EX OINT: TOPICAL_OINTMENT | Freq: Two times a day (BID) | CUTANEOUS | 0 refills | Status: DC

## 2022-10-01 NOTE — Assessment & Plan Note (Signed)
Acute Likely allergic in nature-?  Secondary to new eye shadow versus food versus other She is not wearing make-up on her eyelids-rashes not resolved Start Protopic 0.1% twice daily-should improve within a week or 2 Has dermatology appointment if there is no improvement, but will contact me with concerns

## 2022-10-05 ENCOUNTER — Telehealth: Payer: Self-pay | Admitting: Internal Medicine

## 2022-10-05 NOTE — Telephone Encounter (Signed)
Patient called stating that her pharmacy faxed over a PA for medication tacrolimus (PROTOPIC) 0.1 % ointment. That needs to be filled out for insurance to cover. Patient stated that she doesn't know if Dr. Quay Burow wants to prescribed something else instead. But patient also stated that her eyes are getting better so if Dr. Quay Burow doesn't thinks she needs the ointment anymore that's fine too. Patient request a callback at [phone].

## 2022-10-06 NOTE — Telephone Encounter (Signed)
No need for prior Auth.  She can try over-the-counter cortisone cream which is the lowest dose steroid and use it once a day for several days and that should make it go away faster.  As long as she does not use it more than 2 weeks it is very safe.

## 2022-10-06 NOTE — Telephone Encounter (Signed)
Spoke with patient today. 

## 2022-10-18 ENCOUNTER — Other Ambulatory Visit: Payer: Self-pay | Admitting: Obstetrics and Gynecology

## 2022-10-18 DIAGNOSIS — Z1231 Encounter for screening mammogram for malignant neoplasm of breast: Secondary | ICD-10-CM

## 2022-12-10 ENCOUNTER — Ambulatory Visit
Admission: RE | Admit: 2022-12-10 | Discharge: 2022-12-10 | Disposition: A | Discharge status: Home / Self Care | Payer: BC Managed Care – PPO | Source: Ambulatory Visit

## 2022-12-10 DIAGNOSIS — Z1231 Encounter for screening mammogram for malignant neoplasm of breast: Secondary | ICD-10-CM

## 2023-01-04 ENCOUNTER — Other Ambulatory Visit (INDEPENDENT_AMBULATORY_CARE_PROVIDER_SITE_OTHER): Payer: BC Managed Care – PPO

## 2023-01-04 DIAGNOSIS — I10 Essential (primary) hypertension: Secondary | ICD-10-CM | POA: Diagnosis not present

## 2023-01-04 LAB — BASIC METABOLIC PANEL
BUN: 19 mg/dL (ref 6–23)
CO2: 29 mEq/L (ref 19–32)
Calcium: 10 mg/dL (ref 8.4–10.5)
Chloride: 100 mEq/L (ref 96–112)
Creatinine, Ser: 0.78 mg/dL (ref 0.40–1.20)
GFR: 83.67 mL/min (ref >60.00)
Glucose, Bld: 88 mg/dL (ref 70–99)
Potassium: 4 mEq/L (ref 3.5–5.1)
Sodium: 138 mEq/L (ref 135–145)

## 2023-01-12 DIAGNOSIS — Z6834 Body mass index (BMI) 34.0-34.9, adult: Secondary | ICD-10-CM | POA: Diagnosis not present

## 2023-01-12 DIAGNOSIS — Z01419 Encounter for gynecological examination (general) (routine) without abnormal findings: Secondary | ICD-10-CM | POA: Diagnosis not present

## 2023-01-19 DIAGNOSIS — Z13220 Encounter for screening for lipoid disorders: Secondary | ICD-10-CM | POA: Diagnosis not present

## 2023-01-19 DIAGNOSIS — Z1321 Encounter for screening for nutritional disorder: Secondary | ICD-10-CM | POA: Diagnosis not present

## 2023-01-19 DIAGNOSIS — Z131 Encounter for screening for diabetes mellitus: Secondary | ICD-10-CM | POA: Diagnosis not present

## 2023-01-19 DIAGNOSIS — Z13228 Encounter for screening for other metabolic disorders: Secondary | ICD-10-CM | POA: Diagnosis not present

## 2023-01-19 DIAGNOSIS — Z1329 Encounter for screening for other suspected endocrine disorder: Secondary | ICD-10-CM | POA: Diagnosis not present

## 2023-01-20 LAB — LAB REPORT - SCANNED
A1c: 5.8
EGFR: 84

## 2023-01-26 DIAGNOSIS — L821 Other seborrheic keratosis: Secondary | ICD-10-CM | POA: Diagnosis not present

## 2023-01-26 DIAGNOSIS — D229 Melanocytic nevi, unspecified: Secondary | ICD-10-CM | POA: Diagnosis not present

## 2023-01-26 DIAGNOSIS — L918 Other hypertrophic disorders of the skin: Secondary | ICD-10-CM | POA: Diagnosis not present

## 2023-01-26 DIAGNOSIS — Z85828 Personal history of other malignant neoplasm of skin: Secondary | ICD-10-CM | POA: Diagnosis not present

## 2023-04-21 ENCOUNTER — Other Ambulatory Visit: Payer: Self-pay | Admitting: Internal Medicine

## 2023-04-26 ENCOUNTER — Other Ambulatory Visit: Payer: BC Managed Care – PPO

## 2023-05-01 ENCOUNTER — Encounter: Payer: Self-pay | Admitting: Internal Medicine

## 2023-05-01 DIAGNOSIS — R7303 Prediabetes: Secondary | ICD-10-CM | POA: Insufficient documentation

## 2023-05-01 NOTE — Progress Notes (Unsigned)
Subjective:    Patient ID: [name], female    DOB: February 21, 1966, 57 y.o.   MRN: [id number]     HPI [name] is here for follow up of her chronic medical problems.  Sinus issues - taking allegra.  Has sinus pressure, teeth pain, raspy voice.  Has allergies.  Seems worse than normal.  She is concerned she may have an infection.  She is walking in the morning.    Medications and allergies reviewed with patient and updated if appropriate.  Current Outpatient Medications on File Prior to Visit  Medication Sig Dispense Refill   alendronate (FOSAMAX) 70 MG tablet Fosamax 70 mg tablet  Take 1 tablet every week by oral route for 30 days.  take first thing in the morning on an empty stomack with full glass of water.  Do not eat or drink anything else for 30 minutes.  Do not lay down or bend over for 30 minutes.     amLODipine (NORVASC) 5 MG tablet Take 1 tablet (5 mg total) by mouth every morning. 30 tablet 2   Cholecalciferol (VITAMIN D) 50 MCG (2000 UT) CAPS      fexofenadine (ALLEGRA) 180 MG tablet Take 180 mg by mouth daily. PRN     hydrochlorothiazide (HYDRODIURIL) 12.5 MG tablet 12.5 mg.      MAGNESIUM GLUCONATE PO Take by mouth.     Multiple Vitamins-Minerals (MULTIVITAMIN ADULTS PO) Take by mouth daily.     tacrolimus (PROTOPIC) 0.1 % ointment Apply topically 2 (two) times daily. 30 g 0   No current facility-administered medications on file prior to visit.     Review of Systems  Constitutional:  Negative for fever.  HENT:  Positive for congestion, postnasal drip, sinus pain and sore throat (in morning).   Respiratory:  Positive for cough (past few day - discolored). Negative for shortness of breath and wheezing.   Cardiovascular:  Negative for chest pain, palpitations and leg swelling.  Neurological:  Positive for headaches. Negative for light-headedness.       Objective:   Vitals:   05/02/23 1258 05/02/23 1344  BP: (!) 140/90 130/80  Pulse: (!) 105   Temp:  98.2 F (36.8 C)   SpO2: 98%    BP Readings from Last 3 Encounters:  05/02/23 130/80  04/14/22 136/84  01/15/21 (!) 140/94   Wt Readings from Last 3 Encounters:  05/02/23 198 lb (89.8 kg)  04/14/22 202 lb 3.2 oz (91.7 kg)  10/05/21 195 lb (88.5 kg)   Body mass index is 33.99 kg/m.    Physical Exam Constitutional:      General: She is not in acute distress.    Appearance: Normal appearance.  HENT:     Head: Normocephalic and atraumatic.  Eyes:     Conjunctiva/sclera: Conjunctivae normal.  Cardiovascular:     Rate and Rhythm: Normal rate and regular rhythm.     Heart sounds: Normal heart sounds.  Pulmonary:     Effort: Pulmonary effort is normal. No respiratory distress.     Breath sounds: Normal breath sounds. No wheezing.  Musculoskeletal:     Cervical back: Neck supple.     Right lower leg: No edema.     Left lower leg: No edema.  Lymphadenopathy:     Cervical: No cervical adenopathy.  Skin:    General: Skin is warm and dry.     Findings: No rash.  Neurological:     Mental Status: She is alert. Mental status  is at baseline.  Psychiatric:        Mood and Affect: Mood normal.        Behavior: Behavior normal.        Lab Results  Component Value Date   WBC 7.4 02/13/2020   HGB 14.8 02/13/2020   HCT 45.9 02/13/2020   PLT 289 02/13/2020   GLUCOSE 99 05/02/2023   ALT 20 05/02/2023   AST 20 05/02/2023   NA 138 05/02/2023   K 3.4 (L) 05/02/2023   CL 101 05/02/2023   CREATININE 0.77 05/02/2023   BUN 17 05/02/2023   CO2 26 05/02/2023   HGBA1C 5.5 05/02/2023     Assessment & Plan:    See Problem List for Assessment and Plan of chronic medical problems.

## 2023-05-01 NOTE — Patient Instructions (Addendum)
      Blood work was ordered.   The lab is on the first floor.    Medications changes include :   Augmentin twice daily for the sinus infection     Return in about 1 year (around 05/01/2024) for Physical Exam.

## 2023-05-02 ENCOUNTER — Ambulatory Visit (INDEPENDENT_AMBULATORY_CARE_PROVIDER_SITE_OTHER): Payer: BC Managed Care – PPO | Admitting: Internal Medicine

## 2023-05-02 VITALS — BP 130/80 | HR 105 | Temp 98.2°F | Ht 64.0 in | Wt 198.0 lb

## 2023-05-02 DIAGNOSIS — Z85828 Personal history of other malignant neoplasm of skin: Secondary | ICD-10-CM

## 2023-05-02 DIAGNOSIS — M1611 Unilateral primary osteoarthritis, right hip: Secondary | ICD-10-CM

## 2023-05-02 DIAGNOSIS — I10 Essential (primary) hypertension: Secondary | ICD-10-CM | POA: Diagnosis not present

## 2023-05-02 DIAGNOSIS — R7303 Prediabetes: Secondary | ICD-10-CM | POA: Diagnosis not present

## 2023-05-02 DIAGNOSIS — M816 Localized osteoporosis [Lequesne]: Secondary | ICD-10-CM

## 2023-05-02 DIAGNOSIS — J019 Acute sinusitis, unspecified: Secondary | ICD-10-CM | POA: Insufficient documentation

## 2023-05-02 LAB — VITAMIN D 25 HYDROXY (VIT D DEFICIENCY, FRACTURES): VITD: 62.29 ng/mL (ref 30.00–100.00)

## 2023-05-02 LAB — COMPREHENSIVE METABOLIC PANEL
ALT: 20 U/L (ref 0–35)
AST: 20 U/L (ref 0–37)
Albumin: 4.6 g/dL (ref 3.5–5.2)
Alkaline Phosphatase: 49 U/L (ref 39–117)
BUN: 17 mg/dL (ref 6–23)
CO2: 26 mEq/L (ref 19–32)
Calcium: 9.8 mg/dL (ref 8.4–10.5)
Chloride: 101 mEq/L (ref 96–112)
Creatinine, Ser: 0.77 mg/dL (ref 0.40–1.20)
GFR: 84.79 mL/min (ref >60.00)
Glucose, Bld: 99 mg/dL (ref 70–99)
Potassium: 3.4 mEq/L — ABNORMAL LOW (ref 3.5–5.1)
Sodium: 138 mEq/L (ref 135–145)
Total Bilirubin: 0.7 mg/dL (ref 0.2–1.2)
Total Protein: 7.4 g/dL (ref 6.0–8.3)

## 2023-05-02 LAB — HEMOGLOBIN A1C: Hgb A1c MFr Bld: 5.5 % (ref 4.6–6.5)

## 2023-05-02 MED ORDER — AMOXICILLIN-POT CLAVULANATE 875-125 MG PO TABS: 1.0000 | ORAL_TABLET | Freq: Two times a day (BID) | ORAL | 0 refills | Status: AC

## 2023-05-02 NOTE — Assessment & Plan Note (Signed)
Following with dermatology annually 

## 2023-05-02 NOTE — Assessment & Plan Note (Signed)
Acute Likely bacterial  Start Augmentin 875-125 mg BID x 10 day otc cold medications Rest, fluid Call if no improvement  

## 2023-05-02 NOTE — Assessment & Plan Note (Addendum)
Chronic Managed by Dr. Arelia Sneddon Alendronate 70 mg weekly He had vitamin D level checked a few months ago was about 60-decreased from 2000 units daily to 1000 units daily Okay to continue 1000 units daily Check vitamin D level today

## 2023-05-02 NOTE — Assessment & Plan Note (Addendum)
Chronic Check a1c Low sugar / carb diet Stressed regular exercise-currently walking regularly

## 2023-05-02 NOTE — Assessment & Plan Note (Signed)
Chronic Following with Dr Lequita Halt Exercising regularly

## 2023-05-02 NOTE — Assessment & Plan Note (Addendum)
Chronic BP slightly elevated initially, better on repeat Continue amlodipine 5 mg daily, hctz 12.5 mg daily Has had low potassium -likely related to hydrochlorothiazide Eating higher potassium foods Continue regular exercise, healthy diet CBC, CMP

## 2023-05-18 ENCOUNTER — Other Ambulatory Visit: Payer: Self-pay | Admitting: Internal Medicine

## 2023-06-02 DIAGNOSIS — N9089 Other specified noninflammatory disorders of vulva and perineum: Secondary | ICD-10-CM | POA: Diagnosis not present

## 2023-11-07 ENCOUNTER — Other Ambulatory Visit: Payer: Self-pay | Admitting: Internal Medicine

## 2023-11-17 ENCOUNTER — Other Ambulatory Visit: Payer: Self-pay | Admitting: Internal Medicine

## 2023-11-17 DIAGNOSIS — Z1231 Encounter for screening mammogram for malignant neoplasm of breast: Secondary | ICD-10-CM

## 2023-11-18 DIAGNOSIS — Z13820 Encounter for screening for osteoporosis: Secondary | ICD-10-CM | POA: Diagnosis not present

## 2023-11-18 LAB — HM DEXA SCAN

## 2023-12-03 ENCOUNTER — Other Ambulatory Visit: Payer: Self-pay | Admitting: Internal Medicine

## 2023-12-15 ENCOUNTER — Ambulatory Visit
Admission: RE | Admit: 2023-12-15 | Discharge: 2023-12-15 | Disposition: A | Discharge status: Home / Self Care | Payer: BC Managed Care – PPO | Source: Ambulatory Visit

## 2023-12-15 DIAGNOSIS — Z1231 Encounter for screening mammogram for malignant neoplasm of breast: Secondary | ICD-10-CM

## 2024-01-02 ENCOUNTER — Other Ambulatory Visit: Payer: Self-pay | Admitting: Internal Medicine

## 2024-01-17 DIAGNOSIS — Z6833 Body mass index (BMI) 33.0-33.9, adult: Secondary | ICD-10-CM | POA: Diagnosis not present

## 2024-01-17 DIAGNOSIS — Z1151 Encounter for screening for human papillomavirus (HPV): Secondary | ICD-10-CM | POA: Diagnosis not present

## 2024-01-17 DIAGNOSIS — Z01419 Encounter for gynecological examination (general) (routine) without abnormal findings: Secondary | ICD-10-CM | POA: Diagnosis not present

## 2024-01-17 DIAGNOSIS — Z124 Encounter for screening for malignant neoplasm of cervix: Secondary | ICD-10-CM | POA: Diagnosis not present

## 2024-01-26 DIAGNOSIS — J309 Allergic rhinitis, unspecified: Secondary | ICD-10-CM | POA: Diagnosis not present

## 2024-01-27 DIAGNOSIS — L814 Other melanin hyperpigmentation: Secondary | ICD-10-CM | POA: Diagnosis not present

## 2024-01-27 DIAGNOSIS — D229 Melanocytic nevi, unspecified: Secondary | ICD-10-CM | POA: Diagnosis not present

## 2024-01-27 DIAGNOSIS — L821 Other seborrheic keratosis: Secondary | ICD-10-CM | POA: Diagnosis not present

## 2024-01-27 DIAGNOSIS — Z85828 Personal history of other malignant neoplasm of skin: Secondary | ICD-10-CM | POA: Diagnosis not present

## 2024-01-27 DIAGNOSIS — D225 Melanocytic nevi of trunk: Secondary | ICD-10-CM | POA: Diagnosis not present

## 2024-01-27 DIAGNOSIS — D485 Neoplasm of uncertain behavior of skin: Secondary | ICD-10-CM | POA: Diagnosis not present

## 2024-01-27 DIAGNOSIS — L816 Other disorders of diminished melanin formation: Secondary | ICD-10-CM | POA: Diagnosis not present

## 2024-01-27 DIAGNOSIS — D235 Other benign neoplasm of skin of trunk: Secondary | ICD-10-CM | POA: Diagnosis not present

## 2024-02-13 DIAGNOSIS — M545 Low back pain, unspecified: Secondary | ICD-10-CM | POA: Diagnosis not present

## 2024-02-13 DIAGNOSIS — M546 Pain in thoracic spine: Secondary | ICD-10-CM | POA: Diagnosis not present

## 2024-02-15 DIAGNOSIS — H9311 Tinnitus, right ear: Secondary | ICD-10-CM | POA: Diagnosis not present

## 2024-02-15 DIAGNOSIS — J309 Allergic rhinitis, unspecified: Secondary | ICD-10-CM | POA: Diagnosis not present

## 2024-02-19 ENCOUNTER — Encounter: Payer: Self-pay | Admitting: Internal Medicine

## 2024-02-22 DIAGNOSIS — M2569 Stiffness of other specified joint, not elsewhere classified: Secondary | ICD-10-CM | POA: Diagnosis not present

## 2024-02-22 DIAGNOSIS — M549 Dorsalgia, unspecified: Secondary | ICD-10-CM | POA: Diagnosis not present

## 2024-02-27 DIAGNOSIS — M25551 Pain in right hip: Secondary | ICD-10-CM | POA: Diagnosis not present

## 2024-02-27 DIAGNOSIS — M549 Dorsalgia, unspecified: Secondary | ICD-10-CM | POA: Diagnosis not present

## 2024-02-27 DIAGNOSIS — M2569 Stiffness of other specified joint, not elsewhere classified: Secondary | ICD-10-CM | POA: Diagnosis not present

## 2024-02-29 DIAGNOSIS — M546 Pain in thoracic spine: Secondary | ICD-10-CM | POA: Diagnosis not present

## 2024-02-29 DIAGNOSIS — M2569 Stiffness of other specified joint, not elsewhere classified: Secondary | ICD-10-CM | POA: Diagnosis not present

## 2024-02-29 DIAGNOSIS — M25551 Pain in right hip: Secondary | ICD-10-CM | POA: Diagnosis not present

## 2024-03-01 ENCOUNTER — Ambulatory Visit: Admitting: Allergy

## 2024-03-01 NOTE — Progress Notes (Signed)
 Subjective:    Patient ID: [name], female    DOB: 03-21-1966, 58 y.o.   MRN: [id number]      HPI [name] is here for  Chief Complaint  Patient presents with   Sinusitis    Patient started with ENT regarding ears and was told no wax build-up; ears are still ringing; vibrates sometimes and they ache at times. She took Flonase and Xyzal last week that helped a little bit last week. Xyzal makes her groggy    Ear issues for at least one month.  She saw a PA at [facility].  Ear exam was normal.  She was advised to use Flonase, Xyzal and saline.    Had this years ago and had wax and removing that improved it.   Has tried taking advil, flonase, decongestant, claritin, xyzal.  She has taken a decongestant at times.  Her symptoms have persisted.    Xyzal makes her groggy.    Symptoms improved, she stopped the medication but symptoms came back. Saw ENT again and they said everything looked good.      Has nasal / ear congestion, clogged feeling in the ears, tinnitus and a feeling of fluid in the ear.  There was some ear pain at times.     BP good at home -- white coat htn.  She does monitor it regularly at home.  She has not been taking hydrochlorothiazide regularly-it was initially prescribed for edema and wonders if she needs to take it.   Medications and allergies reviewed with patient and updated if appropriate.  Current Outpatient Medications on File Prior to Visit  Medication Sig Dispense Refill   alendronate (FOSAMAX) 70 MG tablet Fosamax 70 mg tablet  Take 1 tablet every week by oral route for 30 days.  take first thing in the morning on an empty stomack with full glass of water.  Do not eat or drink anything else for 30 minutes.  Do not lay down or bend over for 30 minutes.     amLODipine  (NORVASC ) 5 MG tablet TAKE 1 TABLET BY MOUTH ONCE DAILY IN THE MORNING 60 tablet 0   Cholecalciferol (VITAMIN D ) 50 MCG (2000 UT) CAPS      fexofenadine (ALLEGRA) 180 MG tablet Take  180 mg by mouth daily. PRN     hydrochlorothiazide (HYDRODIURIL) 12.5 MG tablet 12.5 mg.      levocetirizine (XYZAL) 5 MG tablet Take one tablet every night.     MAGNESIUM GLUCONATE PO Take by mouth.     Multiple Vitamins-Minerals (MULTIVITAMIN ADULTS PO) Take by mouth daily.     tacrolimus  (PROTOPIC ) 0.1 % ointment Apply topically 2 (two) times daily. 30 g 0   No current facility-administered medications on file prior to visit.    Review of Systems  Constitutional:  Negative for fever.  HENT:  Positive for congestion, postnasal drip, sinus pressure (mild), sore throat (a couple of mornings) and tinnitus. Negative for ear pain (ears popping, transient ache) and hearing loss.        Ears feel like they are vibrating, fluid feeling in ears  Respiratory:  Negative for cough, shortness of breath and wheezing.   Neurological:  Negative for dizziness, light-headedness and headaches.       Objective:   Vitals:   03/02/24 1310  BP: (!) 150/88  Pulse: (!) 105  Temp: 98 F (36.7 C)  SpO2: 99%   BP Readings from Last 3 Encounters:  03/02/24 (!) 150/88  05/02/23  130/80  04/14/22 136/84   Wt Readings from Last 3 Encounters:  03/02/24 190 lb (86.2 kg)  05/02/23 198 lb (89.8 kg)  04/14/22 202 lb 3.2 oz (91.7 kg)   Body mass index is 32.61 kg/m.    Physical Exam Constitutional:      General: She is not in acute distress.    Appearance: Normal appearance. She is not ill-appearing.  HENT:     Head: Normocephalic and atraumatic.     Right Ear: Tympanic membrane, ear canal and external ear normal.     Left Ear: Tympanic membrane, ear canal and external ear normal.     Mouth/Throat:     Mouth: Mucous membranes are moist.     Pharynx: No oropharyngeal exudate or posterior oropharyngeal erythema.  Eyes:     Conjunctiva/sclera: Conjunctivae normal.  Cardiovascular:     Rate and Rhythm: Normal rate and regular rhythm.  Pulmonary:     Effort: Pulmonary effort is normal. No  respiratory distress.     Breath sounds: Normal breath sounds. No wheezing or rales.  Musculoskeletal:     Cervical back: Neck supple. No tenderness.  Lymphadenopathy:     Cervical: No cervical adenopathy.  Skin:    General: Skin is warm and dry.  Neurological:     Mental Status: She is alert.            Assessment & Plan:    See Problem List for Assessment and Plan of chronic medical problems.

## 2024-03-01 NOTE — Patient Instructions (Addendum)
       Medications changes include :   continue anti-histamine and flonase consistently.       If your symptoms are not improving we can try oral steroids.       Return if symptoms worsen or fail to improve.

## 2024-03-02 ENCOUNTER — Ambulatory Visit (INDEPENDENT_AMBULATORY_CARE_PROVIDER_SITE_OTHER): Admitting: Internal Medicine

## 2024-03-02 ENCOUNTER — Encounter: Payer: Self-pay | Admitting: Internal Medicine

## 2024-03-02 VITALS — BP 146/90 | HR 105 | Temp 98.0°F | Ht 64.0 in | Wt 190.0 lb

## 2024-03-02 DIAGNOSIS — I10 Essential (primary) hypertension: Secondary | ICD-10-CM

## 2024-03-02 DIAGNOSIS — J309 Allergic rhinitis, unspecified: Secondary | ICD-10-CM

## 2024-03-02 DIAGNOSIS — H6993 Unspecified Eustachian tube disorder, bilateral: Secondary | ICD-10-CM | POA: Diagnosis not present

## 2024-03-02 LAB — BASIC METABOLIC PANEL WITH GFR
BUN: 20 mg/dL (ref 6–23)
CO2: 28 meq/L (ref 19–32)
Calcium: 9.8 mg/dL (ref 8.4–10.5)
Chloride: 101 meq/L (ref 96–112)
Creatinine, Ser: 0.72 mg/dL (ref 0.40–1.20)
GFR: 91.36 mL/min (ref >60.00)
Glucose, Bld: 104 mg/dL — ABNORMAL HIGH (ref 70–99)
Potassium: 3.6 meq/L (ref 3.5–5.1)
Sodium: 137 meq/L (ref 135–145)

## 2024-03-02 MED ORDER — HYDROCHLOROTHIAZIDE 12.5 MG PO TABS: 12.5000 mg | ORAL_TABLET | Freq: Every day | ORAL | 2 refills | Status: DC

## 2024-03-02 NOTE — Assessment & Plan Note (Signed)
 Chronic Has seasonal allergies, but this year has been worse Her symptoms she is experiencing are related to seasonal allergies No evidence of infection She likely has an element of eustachian tube dysfunction and tinnitus secondary to the allergies Continue oral antihistamine 1-2 times a day-okay to take twice a day for short period of time Continue Flonase daily Call if symptoms are not controlled

## 2024-03-02 NOTE — Assessment & Plan Note (Addendum)
 Acute Symptoms likely related to eustachian tube dysfunction secondary to allergic rhinitis No evidence of infection Continue oral antihistamine 1-2 times daily Continue Flonase daily Okay to take decongestant daily as needed as long as it is not causing elevated blood pressure Discussed she needs to take these medications even if her symptoms improve for at least a couple of weeks and then she may be able to stop them to see if she still needs them, but may still need them If no improvement could consider short prednisone course

## 2024-03-02 NOTE — Assessment & Plan Note (Signed)
 Chronic Blood pressure elevated here, but she does have anxiety and an element of whitecoat hypertension which makes it hard to evaluate BP sounds better controlled at home-advised that she monitor regularly and discussed goal of less than 130/80 Advised taking the hydrochlorothiazide daily in addition to the amlodipine  and monitoring BP to see if she needs it or not needs it BMP

## 2024-03-03 ENCOUNTER — Other Ambulatory Visit: Payer: Self-pay | Admitting: Internal Medicine

## 2024-03-05 DIAGNOSIS — M25551 Pain in right hip: Secondary | ICD-10-CM | POA: Diagnosis not present

## 2024-03-05 DIAGNOSIS — M545 Low back pain, unspecified: Secondary | ICD-10-CM | POA: Diagnosis not present

## 2024-03-05 DIAGNOSIS — M2569 Stiffness of other specified joint, not elsewhere classified: Secondary | ICD-10-CM | POA: Diagnosis not present

## 2024-03-08 DIAGNOSIS — M2569 Stiffness of other specified joint, not elsewhere classified: Secondary | ICD-10-CM | POA: Diagnosis not present

## 2024-03-08 DIAGNOSIS — M25551 Pain in right hip: Secondary | ICD-10-CM | POA: Diagnosis not present

## 2024-03-08 DIAGNOSIS — M545 Low back pain, unspecified: Secondary | ICD-10-CM | POA: Diagnosis not present

## 2024-03-09 ENCOUNTER — Encounter: Payer: Self-pay | Admitting: Internal Medicine

## 2024-03-09 DIAGNOSIS — H9190 Unspecified hearing loss, unspecified ear: Secondary | ICD-10-CM

## 2024-03-09 DIAGNOSIS — H6993 Unspecified Eustachian tube disorder, bilateral: Secondary | ICD-10-CM

## 2024-03-10 MED ORDER — PREDNISONE 20 MG PO TABS: 40.0000 mg | ORAL_TABLET | Freq: Every day | ORAL | 0 refills | Status: AC

## 2024-03-15 DIAGNOSIS — M25551 Pain in right hip: Secondary | ICD-10-CM | POA: Diagnosis not present

## 2024-03-15 DIAGNOSIS — M545 Low back pain, unspecified: Secondary | ICD-10-CM | POA: Diagnosis not present

## 2024-03-15 DIAGNOSIS — M2569 Stiffness of other specified joint, not elsewhere classified: Secondary | ICD-10-CM | POA: Diagnosis not present

## 2024-03-21 DIAGNOSIS — M2569 Stiffness of other specified joint, not elsewhere classified: Secondary | ICD-10-CM | POA: Diagnosis not present

## 2024-03-21 DIAGNOSIS — M545 Low back pain, unspecified: Secondary | ICD-10-CM | POA: Diagnosis not present

## 2024-03-21 DIAGNOSIS — M25551 Pain in right hip: Secondary | ICD-10-CM | POA: Diagnosis not present

## 2024-03-21 DIAGNOSIS — M549 Dorsalgia, unspecified: Secondary | ICD-10-CM | POA: Diagnosis not present

## 2024-04-30 ENCOUNTER — Other Ambulatory Visit: Payer: Self-pay | Admitting: Internal Medicine

## 2024-05-22 ENCOUNTER — Institutional Professional Consult (permissible substitution) (INDEPENDENT_AMBULATORY_CARE_PROVIDER_SITE_OTHER): Admitting: Otolaryngology

## 2024-05-22 ENCOUNTER — Ambulatory Visit (INDEPENDENT_AMBULATORY_CARE_PROVIDER_SITE_OTHER): Admitting: Audiology

## 2024-06-18 NOTE — Patient Instructions (Addendum)
 Shingles vaccine #2 today   Blood work was ordered.       Medications changes include :   azelastine  nasal spray    Return in about 1 year (around 06/19/2025) for Physical Exam.    Health Maintenance, Female Adopting a healthy lifestyle and getting preventive care are important in promoting health and wellness. Ask your health care provider about: The right schedule for you to have regular tests and exams. Things you can do on your own to prevent diseases and keep yourself healthy. What should I know about diet, weight, and exercise? Eat a healthy diet  Eat a diet that includes plenty of vegetables, fruits, low-fat dairy products, and lean protein. Do not eat a lot of foods that are high in solid fats, added sugars, or sodium. Maintain a healthy weight Body mass index (BMI) is used to identify weight problems. It estimates body fat based on height and weight. Your health care provider can help determine your BMI and help you achieve or maintain a healthy weight. Get regular exercise Get regular exercise. This is one of the most important things you can do for your health. Most adults should: Exercise for at least 150 minutes each week. The exercise should increase your heart rate and make you sweat (moderate-intensity exercise). Do strengthening exercises at least twice a week. This is in addition to the moderate-intensity exercise. Spend less time sitting. Even light physical activity can be beneficial. Watch cholesterol and blood lipids Have your blood tested for lipids and cholesterol at 60 years of age, then have this test every 5 years. Have your cholesterol levels checked more often if: Your lipid or cholesterol levels are high. You are older than 60 years of age. You are at high risk for heart disease. What should I know about cancer screening? Depending on your health history and family history, you may need to have cancer screening at various ages. This may include  screening for: Breast cancer. Cervical cancer. Colorectal cancer. Skin cancer. Lung cancer. What should I know about heart disease, diabetes, and high blood pressure? Blood pressure and heart disease High blood pressure causes heart disease and increases the risk of stroke. This is more likely to develop in people who have high blood pressure readings or are overweight. Have your blood pressure checked: Every 3-5 years if you are 37-55 years of age. Every year if you are 43 years old or older. Diabetes Have regular diabetes screenings. This checks your fasting blood sugar level. Have the screening done: Once every three years after age 20 if you are at a normal weight and have a low risk for diabetes. More often and at a younger age if you are overweight or have a high risk for diabetes. What should I know about preventing infection? Hepatitis B If you have a higher risk for hepatitis B, you should be screened for this virus. Talk with your health care provider to find out if you are at risk for hepatitis B infection. Hepatitis C Testing is recommended for: Everyone born from [birth year] through 1967. Anyone with known risk factors for hepatitis C. Sexually transmitted infections (STIs) Get screened for STIs, including gonorrhea and chlamydia, if: You are sexually active and are younger than 60 years of age. You are older than 60 years of age and your health care provider tells you that you are at risk for this type of infection. Your sexual activity has changed since you were last screened, and you are at increased  risk for chlamydia or gonorrhea. Ask your health care provider if you are at risk. Ask your health care provider about whether you are at high risk for HIV. Your health care provider may recommend a prescription medicine to help prevent HIV infection. If you choose to take medicine to prevent HIV, you should first get tested for HIV. You should then be tested every 3 months for as  long as you are taking the medicine. Pregnancy If you are about to stop having your period (premenopausal) and you may become pregnant, seek counseling before you get pregnant. Take 400 to 800 micrograms (mcg) of folic acid every day if you become pregnant. Ask for birth control (contraception) if you want to prevent pregnancy. Osteoporosis and menopause Osteoporosis is a disease in which the bones lose minerals and strength with aging. This can result in bone fractures. If you are 1 years old or older, or if you are at risk for osteoporosis and fractures, ask your health care provider if you should: Be screened for bone loss. Take a calcium or vitamin D  supplement to lower your risk of fractures. Be given hormone replacement therapy (HRT) to treat symptoms of menopause. Follow these instructions at home: Alcohol  use Do not drink alcohol  if: Your health care provider tells you not to drink. You are pregnant, may be pregnant, or are planning to become pregnant. If you drink alcohol : Limit how much you have to: 0-1 drink a day. Know how much alcohol  is in your drink. In the [place] one drink equals one 12 oz bottle of beer (355 mL), one 5 oz glass of wine (148 mL), or one 1 oz glass of hard liquor (44 mL). Lifestyle Do not use any products that contain nicotine or tobacco. These products include cigarettes, chewing tobacco, and vaping devices, such as e-cigarettes. If you need help quitting, ask your health care provider. Do not use street drugs. Do not share needles. Ask your health care provider for help if you need support or information about quitting drugs. General instructions Schedule regular health, dental, and eye exams. Stay current with your vaccines. Tell your health care provider if: You often feel depressed. You have ever been abused or do not feel safe at home. Summary Adopting a healthy lifestyle and getting preventive care are important in promoting health and  wellness. Follow your health care provider's instructions about healthy diet, exercising, and getting tested or screened for diseases. Follow your health care provider's instructions on monitoring your cholesterol and blood pressure. This information is not intended to replace advice given to you by your health care provider. Make sure you discuss any questions you have with your health care provider. Document Revised: 03/09/2021 Document Reviewed: 03/09/2021 Elsevier Patient Education  2024 [employer].

## 2024-06-18 NOTE — Progress Notes (Unsigned)
 Subjective:    Patient ID: [name], female    DOB: 06/09/1966, 58 y.o.   MRN: [id number]      HPI [name] is here for a Physical exam and her chronic medical problems.    Still having sinus symptoms.  They are mild and have improved.  She is concentrating on her health-she has had many stressful years in a row and still has a lot of stress, but is trying to work on her health more since she feels like she has not had the time to do that.  Medications and allergies reviewed with patient and updated if appropriate.  Current Outpatient Medications on File Prior to Visit  Medication Sig Dispense Refill   alendronate (FOSAMAX) 70 MG tablet Fosamax 70 mg tablet  Take 1 tablet every week by oral route for 30 days.  take first thing in the morning on an empty stomack with full glass of water.  Do not eat or drink anything else for 30 minutes.  Do not lay down or bend over for 30 minutes.     amLODipine  (NORVASC ) 5 MG tablet TAKE 1 TABLET BY MOUTH ONCE DAILY IN THE EVENING . APPOINTMENT REQUIRED FOR FUTURE REFILLS 60 tablet 0   Cholecalciferol (VITAMIN D ) 50 MCG (2000 UT) CAPS      fexofenadine (ALLEGRA) 180 MG tablet Take 180 mg by mouth daily. PRN     hydrochlorothiazide  (HYDRODIURIL ) 12.5 MG tablet Take 1 tablet (12.5 mg total) by mouth daily. 90 tablet 2   levocetirizine (XYZAL) 5 MG tablet Take one tablet every night.     MAGNESIUM GLUCONATE PO Take by mouth.     Multiple Vitamins-Minerals (MULTIVITAMIN ADULTS PO) Take by mouth daily.     No current facility-administered medications on file prior to visit.    Review of Systems  Constitutional:  Negative for fever.  Eyes:  Negative for visual disturbance.  Respiratory:  Negative for cough, shortness of breath and wheezing.   Cardiovascular:  Negative for chest pain, palpitations and leg swelling.  Gastrointestinal:  Negative for abdominal pain, blood in stool, constipation and diarrhea.       No gerd  Genitourinary:   Negative for dysuria.  Musculoskeletal:  Positive for back pain (mid back with certain activiites). Negative for arthralgias.       Hip bursitis  Skin:  Negative for rash.  Neurological:  Negative for light-headedness and headaches.  Psychiatric/Behavioral:  Negative for dysphoric mood. The patient is not nervous/anxious.        Objective:   Vitals:   06/19/24 1304  BP: (!) 142/90  Pulse: 94  Temp: 98.4 F (36.9 C)  SpO2: 98%   Filed Weights   06/19/24 1304  Weight: 187 lb (84.8 kg)   Body mass index is 32.1 kg/m.  BP Readings from Last 3 Encounters:  06/19/24 (!) 142/90  03/02/24 (!) 146/90  05/02/23 130/80    Wt Readings from Last 3 Encounters:  06/19/24 187 lb (84.8 kg)  03/02/24 190 lb (86.2 kg)  05/02/23 198 lb (89.8 kg)       Physical Exam Constitutional: She appears well-developed and well-nourished. No distress.  HENT:  Head: Normocephalic and atraumatic.  Right Ear: External ear normal. Normal ear canal and TM Left Ear: External ear normal.  Normal ear canal and TM Mouth/Throat: Oropharynx is clear and moist.  Eyes: Conjunctivae normal.  Neck: Neck supple. No tracheal deviation present. No thyromegaly present.  No carotid bruit  Cardiovascular: Normal rate,  regular rhythm and normal heart sounds.   No murmur heard.  No edema. Pulmonary/Chest: Effort normal and breath sounds normal. No respiratory distress. She has no wheezes. She has no rales.  Breast: deferred   Abdominal: Soft. She exhibits no distension. There is no tenderness.  Lymphadenopathy: She has no cervical adenopathy.  Skin: Skin is warm and dry. She is not diaphoretic.  Psychiatric: She has a normal mood and affect. Her behavior is normal.     Lab Results  Component Value Date   WBC 7.4 02/13/2020   HGB 14.8 02/13/2020   HCT 45.9 02/13/2020   PLT 289 02/13/2020   GLUCOSE 104 (H) 03/02/2024   ALT 20 05/02/2023   AST 20 05/02/2023   NA 137 03/02/2024   K 3.6 03/02/2024   CL  101 03/02/2024   CREATININE 0.72 03/02/2024   BUN 20 03/02/2024   CO2 28 03/02/2024   HGBA1C 5.5 05/02/2023         Assessment & Plan:   Physical exam: Screening blood work  ordered Exercise  walking, wants to start resistance training Weight  obese-has lost some weight and will continue to work on weight loss efforts Substance abuse  none   Reviewed recommended immunizations.  Shingles #2 today   Health Maintenance  Topic Date Due   DEXA SCAN  Never done   Hepatitis B Vaccines 19-59 Average Risk (1 of 3 - 19+ 3-dose series) Never done   Zoster Vaccines- Shingrix  (1 of 2) 07/04/1983   Cervical Cancer Screening (HPV/Pap Cotest)  Never done   Pneumococcal Vaccine: 50+ Years (1 of 1 - PCV) Never done   INFLUENZA VACCINE  06/01/2024   COVID-19 Vaccine (4 - 2024-25 season) 07/05/2024 (Originally 07/03/2023)   MAMMOGRAM  12/14/2025   Colonoscopy  09/18/2027   DTaP/Tdap/Td (2 - Td or Tdap) 07/21/2031   HPV VACCINES  Aged Out   Meningococcal B Vaccine  Aged Out   Hepatitis C Screening  Discontinued   HIV Screening  Discontinued          See Problem List for Assessment and Plan of chronic medical problems.

## 2024-06-19 ENCOUNTER — Encounter: Payer: Self-pay | Admitting: Internal Medicine

## 2024-06-19 ENCOUNTER — Ambulatory Visit (INDEPENDENT_AMBULATORY_CARE_PROVIDER_SITE_OTHER): Admitting: Internal Medicine

## 2024-06-19 VITALS — BP 134/78 | HR 94 | Temp 98.4°F | Ht 64.0 in | Wt 187.0 lb

## 2024-06-19 DIAGNOSIS — J309 Allergic rhinitis, unspecified: Secondary | ICD-10-CM

## 2024-06-19 DIAGNOSIS — Z23 Encounter for immunization: Secondary | ICD-10-CM

## 2024-06-19 DIAGNOSIS — I10 Essential (primary) hypertension: Secondary | ICD-10-CM | POA: Diagnosis not present

## 2024-06-19 DIAGNOSIS — M816 Localized osteoporosis [Lequesne]: Secondary | ICD-10-CM | POA: Diagnosis not present

## 2024-06-19 DIAGNOSIS — R7303 Prediabetes: Secondary | ICD-10-CM | POA: Diagnosis not present

## 2024-06-19 DIAGNOSIS — Z0000 Encounter for general adult medical examination without abnormal findings: Secondary | ICD-10-CM | POA: Diagnosis not present

## 2024-06-19 MED ORDER — AZELASTINE HCL 0.1 % NA SOLN: 2.0000 | Freq: Two times a day (BID) | NASAL | 12 refills | Status: AC

## 2024-06-19 NOTE — Assessment & Plan Note (Signed)
 Chronic Lab Results  Component Value Date   HGBA1C 5.5 05/02/2023   Check a1c Low sugar / carb diet Stressed regular exercise-currently walking regularly

## 2024-06-19 NOTE — Addendum Note (Signed)
 Addended by: CLAUDENE TOBIAS PARAS on: 06/19/2024 04:35 PM   Modules accepted: Orders

## 2024-06-19 NOTE — Assessment & Plan Note (Addendum)
 Chronic Blood pressure improved with recheck-well-controlled at home She does have some whitecoat hypertension, but I am concerned her blood pressure is not controlled enough She will monitor consistently for couple weeks and make sure it is consistently less than 130/80 CMP, CBC, lipid, TSH Continue amlodipine  5 mg daily, hydrochlorothiazide  12.5 mg daily

## 2024-06-19 NOTE — Assessment & Plan Note (Signed)
 Chronic Taking flonase - would like to try astelin  - sent to pharmacy Continue allegra

## 2024-06-19 NOTE — Assessment & Plan Note (Addendum)
 Chronic Managed by Dr. McComb Alendronate 70 mg weekly-started early 2023 Taking vitamin D  1000 units daily Check vitamin D  level

## 2024-06-24 ENCOUNTER — Other Ambulatory Visit: Payer: Self-pay | Admitting: Internal Medicine

## 2024-06-27 ENCOUNTER — Other Ambulatory Visit (INDEPENDENT_AMBULATORY_CARE_PROVIDER_SITE_OTHER)

## 2024-06-27 DIAGNOSIS — I10 Essential (primary) hypertension: Secondary | ICD-10-CM | POA: Diagnosis not present

## 2024-06-27 DIAGNOSIS — R7303 Prediabetes: Secondary | ICD-10-CM

## 2024-06-27 DIAGNOSIS — M816 Localized osteoporosis [Lequesne]: Secondary | ICD-10-CM | POA: Diagnosis not present

## 2024-06-27 LAB — CBC WITH DIFFERENTIAL/PLATELET
Basophils Absolute: 0 K/uL (ref 0.0–0.1)
Basophils Relative: 0.6 % (ref 0.0–3.0)
Eosinophils Absolute: 0.3 K/uL (ref 0.0–0.7)
Eosinophils Relative: 4.1 % (ref 0.0–5.0)
HCT: 44.1 % (ref 36.0–46.0)
Hemoglobin: 14.6 g/dL (ref 12.0–15.0)
Lymphocytes Relative: 34.8 % (ref 12.0–46.0)
Lymphs Abs: 2.3 K/uL (ref 0.7–4.0)
MCHC: 33.1 g/dL (ref 30.0–36.0)
MCV: 88.6 fl (ref 78.0–100.0)
Monocytes Absolute: 0.5 K/uL (ref 0.1–1.0)
Monocytes Relative: 7.9 % (ref 3.0–12.0)
Neutro Abs: 3.5 K/uL (ref 1.4–7.7)
Neutrophils Relative %: 52.6 % (ref 43.0–77.0)
Platelets: 299 K/uL (ref 150.0–400.0)
RBC: 4.97 Mil/uL (ref 3.87–5.11)
RDW: 12.6 % (ref 11.5–15.5)
WBC: 6.6 K/uL (ref 4.0–10.5)

## 2024-06-27 LAB — LIPID PANEL
Cholesterol: 196 mg/dL (ref 0–200)
HDL: 55.8 mg/dL (ref >39.00)
LDL Cholesterol: 118 mg/dL — ABNORMAL HIGH (ref 0–99)
NonHDL: 140.12
Total CHOL/HDL Ratio: 4
Triglycerides: 110 mg/dL (ref 0.0–149.0)
VLDL: 22 mg/dL (ref 0.0–40.0)

## 2024-06-27 LAB — COMPREHENSIVE METABOLIC PANEL WITH GFR
ALT: 17 U/L (ref 0–35)
AST: 18 U/L (ref 0–37)
Albumin: 4.4 g/dL (ref 3.5–5.2)
Alkaline Phosphatase: 53 U/L (ref 39–117)
BUN: 14 mg/dL (ref 6–23)
CO2: 28 meq/L (ref 19–32)
Calcium: 8.9 mg/dL (ref 8.4–10.5)
Chloride: 102 meq/L (ref 96–112)
Creatinine, Ser: 0.73 mg/dL (ref 0.40–1.20)
GFR: 89.66 mL/min (ref >60.00)
Glucose, Bld: 91 mg/dL (ref 70–99)
Potassium: 3.5 meq/L (ref 3.5–5.1)
Sodium: 140 meq/L (ref 135–145)
Total Bilirubin: 0.7 mg/dL (ref 0.2–1.2)
Total Protein: 7.1 g/dL (ref 6.0–8.3)

## 2024-06-27 LAB — TSH: TSH: 1.86 u[IU]/mL (ref 0.35–5.50)

## 2024-06-27 LAB — VITAMIN D 25 HYDROXY (VIT D DEFICIENCY, FRACTURES): VITD: 48.98 ng/mL (ref 30.00–100.00)

## 2024-06-27 LAB — HEMOGLOBIN A1C: Hgb A1c MFr Bld: 5.8 % (ref 4.6–6.5)

## 2024-06-28 ENCOUNTER — Ambulatory Visit: Payer: Self-pay | Admitting: Internal Medicine

## 2024-07-20 DIAGNOSIS — M7062 Trochanteric bursitis, left hip: Secondary | ICD-10-CM | POA: Diagnosis not present

## 2024-07-23 DIAGNOSIS — M7062 Trochanteric bursitis, left hip: Secondary | ICD-10-CM | POA: Diagnosis not present

## 2024-08-27 DIAGNOSIS — B351 Tinea unguium: Secondary | ICD-10-CM | POA: Diagnosis not present

## 2024-08-27 DIAGNOSIS — D485 Neoplasm of uncertain behavior of skin: Secondary | ICD-10-CM | POA: Diagnosis not present

## 2024-08-27 DIAGNOSIS — D0439 Carcinoma in situ of skin of other parts of face: Secondary | ICD-10-CM | POA: Diagnosis not present

## 2024-10-19 DIAGNOSIS — C44329 Squamous cell carcinoma of skin of other parts of face: Secondary | ICD-10-CM | POA: Diagnosis not present

## 2024-11-09 ENCOUNTER — Encounter: Payer: Self-pay | Admitting: Internal Medicine

## 2024-11-09 MED ORDER — ERYTHROMYCIN 5 MG/GM OP OINT: 1.0000 | TOPICAL_OINTMENT | Freq: Every day | OPHTHALMIC | 0 refills | Status: AC

## 2024-11-18 ENCOUNTER — Other Ambulatory Visit: Payer: Self-pay | Admitting: Internal Medicine

## 2024-11-19 ENCOUNTER — Other Ambulatory Visit: Payer: Self-pay | Admitting: Internal Medicine

## 2024-11-19 DIAGNOSIS — Z1231 Encounter for screening mammogram for malignant neoplasm of breast: Secondary | ICD-10-CM

## 2024-11-28 ENCOUNTER — Encounter: Payer: Self-pay | Admitting: *Deleted

## 2024-11-28 NOTE — Progress Notes (Signed)
 [name]                                          MRN: [id number]   11/28/2024   The [facility] Quality Team Specialist reviewed this patient medical record for the purposes of chart review for care gap closure. The following were reviewed: abstraction for care gap closure-controlling blood pressure.    [facility] Quality Team

## 2024-12-18 ENCOUNTER — Ambulatory Visit

## 2024-12-18 DIAGNOSIS — Z1231 Encounter for screening mammogram for malignant neoplasm of breast: Secondary | ICD-10-CM
